# Patient Record
Sex: Female | Born: 1960 | Race: White | Hispanic: No | Marital: Married | Smoking: Former smoker
Health system: Southern US, Community
[De-identification: ages and names within clinical notes are randomized; demographics above are authoritative.]

## PROBLEM LIST (undated history)

## (undated) DIAGNOSIS — T4145XA Adverse effect of unspecified anesthetic, initial encounter: Secondary | ICD-10-CM

## (undated) DIAGNOSIS — T8859XA Other complications of anesthesia, initial encounter: Secondary | ICD-10-CM

## (undated) DIAGNOSIS — F329 Major depressive disorder, single episode, unspecified: Secondary | ICD-10-CM

## (undated) DIAGNOSIS — N6310 Unspecified lump in the right breast, unspecified quadrant: Secondary | ICD-10-CM

## (undated) DIAGNOSIS — E039 Hypothyroidism, unspecified: Secondary | ICD-10-CM

## (undated) DIAGNOSIS — R569 Unspecified convulsions: Secondary | ICD-10-CM

## (undated) DIAGNOSIS — F32A Depression, unspecified: Secondary | ICD-10-CM

## (undated) DIAGNOSIS — F419 Anxiety disorder, unspecified: Secondary | ICD-10-CM

## (undated) DIAGNOSIS — T7840XA Allergy, unspecified, initial encounter: Secondary | ICD-10-CM

## (undated) HISTORY — DX: Hypothyroidism, unspecified: E03.9

## (undated) HISTORY — DX: Anxiety disorder, unspecified: F41.9

## (undated) HISTORY — PX: BREAST BIOPSY: SHX20

## (undated) HISTORY — DX: Major depressive disorder, single episode, unspecified: F32.9

## (undated) HISTORY — PX: COLONOSCOPY: SHX174

## (undated) HISTORY — DX: Unspecified convulsions: R56.9

## (undated) HISTORY — DX: Depression, unspecified: F32.A

## (undated) HISTORY — DX: Allergy, unspecified, initial encounter: T78.40XA

---

## 1998-05-02 ENCOUNTER — Other Ambulatory Visit: Admission: RE | Admit: 1998-05-02 | Discharge: 1998-05-02 | Payer: Self-pay | Admitting: Obstetrics & Gynecology

## 1999-06-23 ENCOUNTER — Other Ambulatory Visit: Admission: RE | Admit: 1999-06-23 | Discharge: 1999-06-23 | Payer: Self-pay | Admitting: Obstetrics & Gynecology

## 2002-11-01 ENCOUNTER — Other Ambulatory Visit: Admission: RE | Admit: 2002-11-01 | Discharge: 2002-11-01 | Payer: Self-pay | Admitting: Obstetrics & Gynecology

## 2004-02-04 ENCOUNTER — Other Ambulatory Visit: Admission: RE | Admit: 2004-02-04 | Discharge: 2004-02-04 | Payer: Self-pay | Admitting: Obstetrics & Gynecology

## 2005-03-25 ENCOUNTER — Other Ambulatory Visit: Admission: RE | Admit: 2005-03-25 | Discharge: 2005-03-25 | Payer: Self-pay | Admitting: Obstetrics & Gynecology

## 2009-12-18 ENCOUNTER — Encounter: Payer: Self-pay | Admitting: Internal Medicine

## 2009-12-19 ENCOUNTER — Encounter (INDEPENDENT_AMBULATORY_CARE_PROVIDER_SITE_OTHER): Payer: Self-pay | Admitting: *Deleted

## 2010-01-13 ENCOUNTER — Encounter (INDEPENDENT_AMBULATORY_CARE_PROVIDER_SITE_OTHER): Payer: Self-pay | Admitting: *Deleted

## 2010-01-14 ENCOUNTER — Ambulatory Visit: Payer: Self-pay | Admitting: Internal Medicine

## 2010-01-27 ENCOUNTER — Ambulatory Visit: Payer: Self-pay | Admitting: Internal Medicine

## 2010-01-27 HISTORY — PX: COLONOSCOPY: SHX174

## 2010-08-13 NOTE — Letter (Signed)
Summary: [facility] Instructions  [facility]  [address] [place], [place] [address]   Phone: [phone]  Fax: [phone]       Destiny Mueller    06/17/1961    MRN: [id number]       Procedure Day Dorna Bloom:  Jake Shark  01/27/10     Arrival Time:  9:30AM     Procedure Time:  10:30AM     Location of Procedure:                    _ X_  [facility] (4th Floor)   PREPARATION FOR COLONOSCOPY WITH MIRALAX  Starting 5 days prior to your procedure 01/22/10 do not eat nuts, seeds, popcorn, corn, beans, peas,  salads, or any raw vegetables.  Do not take any fiber supplements (e.g. Metamucil, Citrucel, and Benefiber). ____________________________________________________________________________________________________   THE DAY BEFORE YOUR PROCEDURE         DATE: 01/26/10  DAY: MONDAY  1   Drink clear liquids the entire day-NO SOLID FOOD  2   Do not drink anything colored red or purple.  Avoid juices with pulp.  No orange juice.  3   Drink at least 64 oz. (8 glasses) of fluid/clear liquids during the day to prevent dehydration and help the prep work efficiently.  CLEAR LIQUIDS INCLUDE: Water Jello Ice Popsicles Tea (sugar ok, no milk/cream) Powdered fruit flavored drinks Coffee (sugar ok, no milk/cream) Gatorade Juice: apple, white grape, white cranberry  Lemonade Clear bullion, consomm, broth Carbonated beverages (any kind) Strained chicken noodle soup Hard Candy  4   Mix the entire bottle of Miralax with 64 oz. of Gatorade/Powerade in the morning and put in the refrigerator to chill.  5   At 3:00 pm take 2 Dulcolax/Bisacodyl tablets.  6   At 4:30 pm take one Reglan/Metoclopramide tablet.  7  Starting at 5:00 pm drink one 8 oz glass of the Miralax mixture every 15-20 minutes until you have finished drinking the entire 64 oz.  You should finish drinking prep around 7:30 or 8:00 pm.  8   If you are nauseated, you may take the 2nd Reglan/Metoclopramide  tablet at 6:30 pm.        9    At 8:00 pm take 2 more DULCOLAX/Bisacodyl tablets.     THE DAY OF YOUR PROCEDURE      DATE:  01/27/10  DAY: Jake Shark  You may drink clear liquids until 8:30AM  (2 HOURS BEFORE PROCEDURE).   MEDICATION INSTRUCTIONS  Unless otherwise instructed, you should take regular prescription medications with a small sip of water as early as possible the morning of your procedure.        OTHER INSTRUCTIONS  You will need a responsible adult at least 51 years of age to accompany you and drive you home.   This person must remain in the waiting room during your procedure.  Wear loose fitting clothing that is easily removed.  Leave jewelry and other valuables at home.  However, you may wish to bring a book to read or an iPod/MP3 player to listen to music as you wait for your procedure to start.  Remove all body piercing jewelry and leave at home.  Total time from sign-in until discharge is approximately 2-3 hours.  You should go home directly after your procedure and rest.  You can resume normal activities the day after your procedure.  The day of your procedure you should not:   Drive  Make legal decisions   Operate machinery   Drink alcohol   Return to work  You will receive specific instructions about eating, activities and medications before you leave.   The above instructions have been reviewed and explained to me by   Wyona Almas RN  January 14, 2010 1:42 PM     I fully understand and can verbalize these instructions _____________________________ Date _______

## 2010-08-13 NOTE — Miscellaneous (Signed)
Summary: [facility] Previsit/prep  Clinical Lists Changes  Medications: Added new medication of DULCOLAX 5 MG  TBEC (BISACODYL) Day before procedure take 2 at 3pm and 2 at 8pm. - Signed Added new medication of METOCLOPRAMIDE HCL 10 MG  TABS (METOCLOPRAMIDE HCL) As per prep instructions. - Signed Added new medication of MIRALAX   POWD (POLYETHYLENE GLYCOL 3350) As per prep  instructions. - Signed Rx of DULCOLAX 5 MG  TBEC (BISACODYL) Day before procedure take 2 at 3pm and 2 at 8pm.;  #4 x 0;  Signed;  Entered by: Wyona Almas RN;  Authorized by: Hart Carwin MD;  Method used: Electronically to [facility]  [phone]*, [address] [place], [place], [place]  [address], Ph: [phone] or [phone], Fax: [phone] Rx of METOCLOPRAMIDE HCL 10 MG  TABS (METOCLOPRAMIDE HCL) As per prep instructions.;  #2 x 0;  Signed;  Entered by: Wyona Almas RN;  Authorized by: Hart Carwin MD;  Method used: Electronically to [facility]  [phone]*, [address] [place], [place], [place]  [address], Ph: [phone] or [phone], Fax: [phone] Rx of MIRALAX   POWD (POLYETHYLENE GLYCOL 3350) As per prep  instructions.;  #255gm x 0;  Signed;  Entered by: Wyona Almas RN;  Authorized by: Hart Carwin MD;  Method used: Electronically to [facility]  [phone]*, [address] [place], [place], [place]  [address], Ph: [phone] or [phone], Fax: [phone] Allergies: Added new allergy or adverse reaction of CODEINE Observations: Added new observation of NKA: F (01/14/2010 13:22)    Prescriptions: MIRALAX   POWD (POLYETHYLENE GLYCOL 3350) As per prep  instructions.  #255gm x 0   Entered by:   Wyona Almas RN   Authorized by:   Hart Carwin MD   Signed by:   Wyona Almas RN on 01/14/2010   Method used:   [facility]ronically to        [facility]  [address] [phone][facility] (retail)       [address]       [place], [place]  [address]       Ph: [phone] or [phone]       Fax: [phone]   RxID:    [id number] METOCLOPRAMIDE HCL 10 MG  TABS (METOCLOPRAMIDE HCL) As per prep instructions.  #2 x 0   Entered by:   Wyona Almas RN   Authorized by:   Hart Carwin MD   Signed by:   Wyona Almas RN on 01/14/2010   Method used:   [facility]ronically to        [facility]  [address] [phone]* (retail)       [address]       [place], [place]  [address]       Ph: [phone] or [phone]       Fax: [phone]   RxID:   [id number] DULCOLAX 5 MG  TBEC (BISACODYL) Day before procedure take 2 at 3pm and 2 at 8pm.  #4 x 0   Entered by:   Wyona Almas RN   Authorized by:   Hart Carwin MD   Signed by:   Wyona Almas RN on 01/14/2010   Method used:   [facility]        [facility]  [address] [phone][facility] ([facility]ail)       [address]  [place], [place]  [address]       Ph: [phone] or [phone]       Fax: [phone]   RxID:   [id number]

## 2010-08-13 NOTE — Letter (Signed)
Summary: [facility]   Imported By: Sherian Rein 12/31/2009 14:41:32  _____________________________________________________________________  External Attachment:    Type:   Image     Comment:   External Document

## 2010-08-13 NOTE — Letter (Signed)
Summary: Previsit letter  [facility]  [address] [place], [place] [address]   Phone: [phone]  Fax: [phone]       12/19/2009 MRN: [id number]  Destiny Mueller [address] [place], [place]  [address]  Dear Ms. Caporale,  Welcome to the Gastroenterology Division at [facility].    You are scheduled to see a nurse for your pre-procedure visit on 01/09/2010 at 1:00PM on the 3rd floor at [facility], [address]  We ask that you try to arrive at our office 15 minutes prior to your appointment time to allow for check-in.  Your nurse visit will consist of discussing your medical and surgical history, your immediate family medical history, and your medications.    Please bring a complete list of all your medications or, if you prefer, bring the medication bottles and we will list them.  We will need to be aware of both prescribed and over the counter drugs.  We will need to know exact dosage information as well.  If you are on blood thinners (Coumadin, Plavix, Aggrenox, Ticlid, etc.) please call our office today/prior to your appointment, as we need to consult with your physician about holding your medication.   Please be prepared to read and sign documents such as consent forms, a financial agreement, and acknowledgement forms.  If necessary, and with your consent, a friend or relative is welcome to sit-in on the nurse visit with you.  Please bring your insurance card so that we may make a copy of it.  If your insurance requires a referral to see a specialist, please bring your referral form from your primary care physician.  No co-pay is required for this nurse visit.     If you cannot keep your appointment, please call [phone] to cancel or reschedule prior to your appointment date.  This allows [place] the opportunity to schedule an appointment for another patient in need of care.    Thank you for choosing [facility] for your medical  needs.  We appreciate the opportunity to care for you.  Please visit [place] at our website  to learn more about our practice.                     Sincerely.                                                                                                                   The Gastroenterology Division

## 2010-08-13 NOTE — Procedures (Signed)
Summary: Colonoscopy  Patient: [name] Note: All result statuses are Final unless otherwise noted.  Tests: (1) Colonoscopy (COL)   COL Colonoscopy           DONE     [facility]     [address]     [place], [place]  [address]           COLONOSCOPY PROCEDURE REPORT           PATIENT:  Destiny Mueller, Destiny Mueller  MR#:  [id number]     BIRTHDATE:  09/12/1960, 49 yrs. old  GENDER:  female     ENDOSCOPIST:  Hedwig Morton. Juanda Chance, MD     REF. BY:  Varney Baas, M.D.     PROCEDURE DATE:  01/27/2010     PROCEDURE:  Colonoscopy 19[address]     ASA CLASS:  Class I     INDICATIONS:  Routine Risk Screening     MEDICATIONS:   Versed 9 mg, Fentanyl 75 mcg, Atropine 0.2 mg           DESCRIPTION OF PROCEDURE:   After the risks benefits and     alternatives of the procedure were thoroughly explained, informed     consent was obtained.  Digital rectal exam was performed and     revealed no rectal masses.   The LB [id number]-[id number] endoscope     was introduced through the anus and advanced to the cecum, which     was identified by both the appendix and ileocecal valve, without     limitations.  The quality of the prep was excellent, using     MiraLax.  The instrument was then slowly withdrawn as the colon     was fully examined.     <<PROCEDUREIMAGES>>           FINDINGS:  No polyps or cancers were seen (see image1, image2,     image3, and image4).   Retroflexed views in the rectum revealed no     abnormalities.    The scope was then withdrawn from the patient     and the procedure completed.           COMPLICATIONS:  None     ENDOSCOPIC IMPRESSION:     1) No polyps or cancers     2) Normal colonoscopy     sinus brdycardia during the procedure, heart rate down to 30/min     with normal B.P and O2 saturations     RECOMMENDATIONS:     1) high fiber diet     consider Atropine prior to next colonoscopy     REPEAT EXAM:  In 10 year(s) for.           ______________________________     Hedwig Morton.  Juanda Chance, MD           CC:           n.     eSIGNED:   Hedwig Morton. Laura Caldas at 01/27/2010 11:41 AM           Charity, Tessier Cidra, [phone]  Note: An exclamation mark (!) indicates a result that was not dispersed into the flowsheet. Document Creation Date: 01/27/2010 11:42 AM _______________________________________________________________________  (1) Order result status: Final Collection or observation date-time: 01/27/2010 11:33 Requested date-time:  Receipt date-time:  Reported date-time:  Referring Physician:   Ordering Physician: Lina Sar [phone]) Specimen Source:  Source: Launa Grill Order Number: [id number] Lab site:   Appended Document: Colonoscopy    Clinical  Lists Changes  Observations: Added new observation of COLONNXTDUE: 01/2020 (01/27/2010 13:01)

## 2013-10-23 ENCOUNTER — Encounter: Payer: Self-pay | Admitting: Family Medicine

## 2013-10-23 ENCOUNTER — Ambulatory Visit (INDEPENDENT_AMBULATORY_CARE_PROVIDER_SITE_OTHER): Payer: BC Managed Care – PPO | Admitting: Family Medicine

## 2013-10-23 VITALS — BP 100/60 | Temp 98.4°F | Ht 68.0 in | Wt 134.0 lb

## 2013-10-23 DIAGNOSIS — F418 Other specified anxiety disorders: Secondary | ICD-10-CM

## 2013-10-23 DIAGNOSIS — F341 Dysthymic disorder: Secondary | ICD-10-CM

## 2013-10-23 DIAGNOSIS — E039 Hypothyroidism, unspecified: Secondary | ICD-10-CM | POA: Insufficient documentation

## 2013-10-23 NOTE — Progress Notes (Signed)
   Subjective:    Patient ID: Destiny Mueller, female    DOB: 30-Mar-1961, 53 y.o.   MRN: [id number]  HPI 53 yr old female to establish with us. She feels fine and has no problems to discuss. She sees Dr. Konrad Doloreson Neal for GYN exams and he as also been treating her depression and anxiety for a number of years. She sees Dr. Talmage NapBalan for her thyroid checks. 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    Review of Systems  Constitutional: Negative.   Respiratory: Negative.   Cardiovascular: Negative.   Neurological: Negative.   Psychiatric/Behavioral: Negative.        Objective:   Physical Exam  Constitutional: She is oriented to person, place, and time. She appears well-developed and well-nourished.  Neck: No thyromegaly present.  Cardiovascular: Normal rate, regular rhythm, normal heart sounds and intact distal pulses.   Pulmonary/Chest: Effort normal and breath sounds normal.  Lymphadenopathy:    She has no cervical adenopathy.  Neurological: She is alert and oriented to person, place, and time.  Psychiatric: She has a normal mood and affect. Her behavior is normal. Thought content normal.          Assessment & Plan:  She seems to be doing well. Recheck prn

## 2014-10-10 ENCOUNTER — Other Ambulatory Visit: Payer: Self-pay | Admitting: Obstetrics & Gynecology

## 2014-10-10 DIAGNOSIS — R928 Other abnormal and inconclusive findings on diagnostic imaging of breast: Secondary | ICD-10-CM

## 2014-10-17 ENCOUNTER — Ambulatory Visit
Admission: RE | Admit: 2014-10-17 | Discharge: 2014-10-17 | Disposition: A | Discharge status: Home / Self Care | Payer: BC Managed Care – PPO | Source: Ambulatory Visit | Attending: Obstetrics & Gynecology | Admitting: Obstetrics & Gynecology

## 2014-10-17 DIAGNOSIS — R928 Other abnormal and inconclusive findings on diagnostic imaging of breast: Secondary | ICD-10-CM

## 2015-07-13 HISTORY — PX: BREAST EXCISIONAL BIOPSY: SUR124

## 2015-10-30 ENCOUNTER — Other Ambulatory Visit: Payer: Self-pay | Admitting: Obstetrics & Gynecology

## 2015-10-30 ENCOUNTER — Ambulatory Visit
Admission: RE | Admit: 2015-10-30 | Discharge: 2015-10-30 | Disposition: A | Discharge status: Home / Self Care | Payer: BC Managed Care – PPO | Source: Ambulatory Visit | Attending: Obstetrics & Gynecology | Admitting: Obstetrics & Gynecology

## 2015-10-30 DIAGNOSIS — R928 Other abnormal and inconclusive findings on diagnostic imaging of breast: Secondary | ICD-10-CM

## 2015-11-06 ENCOUNTER — Ambulatory Visit
Admission: RE | Admit: 2015-11-06 | Discharge: 2015-11-06 | Disposition: A | Discharge status: Home / Self Care | Payer: BC Managed Care – PPO | Source: Ambulatory Visit | Attending: Obstetrics & Gynecology | Admitting: Obstetrics & Gynecology

## 2015-11-06 ENCOUNTER — Other Ambulatory Visit: Payer: Self-pay | Admitting: Obstetrics & Gynecology

## 2015-11-06 DIAGNOSIS — R928 Other abnormal and inconclusive findings on diagnostic imaging of breast: Secondary | ICD-10-CM

## 2015-11-18 ENCOUNTER — Ambulatory Visit: Payer: Self-pay | Admitting: General Surgery

## 2015-11-18 DIAGNOSIS — N6489 Other specified disorders of breast: Secondary | ICD-10-CM

## 2015-11-18 NOTE — H&P (Signed)
Destiny ConverseCristy J. Mueller 11/18/2015 3:32 PM Location: [facility] Patient #: [id number] DOB: [birth year] Married / Language: [place] / Race: White Female  History of Present Illness [name]([name] MD; 11/18/2015 5:26 PM) The patient is a 55 year old female.   Note:She is referred by Dr. 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 She had a slightly atypical area on the mammogram a year ago. This year they're still a vague distortion upper outer quadrant right breast. Image guided biopsy was performed with the above pathology. Wider excision is recommended and so she's been sent here to discuss that. There is no family history of breast or ovarian cancer. No palpable masses. No nipple discharge or breast pain.  Other Problems [name]([name], CMA; 11/18/2015 3:34 PM) Anxiety Disorder Thyroid Disease  Past Surgical History [name]([name], CMA; 11/18/2015 3:34 PM) Oral Surgery  Diagnostic Studies History [name]([name], [place]; 11/18/2015 3:34 PM) Colonoscopy 5-10 years ago Mammogram within last year Pap Smear 1-5 years ago  Allergies [name]([name], CMA; 11/18/2015 3:34 PM) Codeine Sulfate *ANALGESICS - OPIOID*  Medication History [name]([name], CMA; 11/18/2015 3:34 PM) ALPRAZolam (0.5MG  Tablet, Oral) Active. Citalopram Hydrobromide (40MG  Tablet, Oral) Active. Levothyroxine Sodium (75MCG Tablet, Oral) Active. Medications Reconciled  Social History [name]([name], [place]; 11/18/2015 3:34 PM) Alcohol use Moderate alcohol use. Caffeine use Coffee. No drug use Tobacco use Former smoker.  Family History [name]([name], [place]; 11/18/2015 3:34 PM) Hypertension Mother. Thyroid problems Mother, Sister.  Pregnancy / Birth History [name]([name], [place]; 11/18/2015 3:34 PM) Age at menarche 12 years. Age of menopause 50-55 Contraceptive History Oral contraceptives. Gravida 1 Maternal age 56-35 Para 1    Vitals [name]([name]  CMA; 11/18/2015 3:35 PM) 11/18/2015 3:34 PM Weight: 132 lb Height: 68in Body Surface Area: 1.71 m Body Mass Index: 20.07 kg/m  Temp.: 98.98F  Pulse: 69 (Regular)  BP: 128/78 (Sitting, Left Arm, Standard)      Physical Exam [name](Darnesha Diloreto J. Skarlet Lyons MD; 11/18/2015 5:27 PM)  The physical exam findings are as follows: Note:General: Thin female in NAD. Pleasant and cooperative.  HEENT: [place]/AT, no facial masses  EYES: EOMI, no icterus  NECK: Supple, no obvious mass.  CV: RRR, no murmur, no JVD.  CHEST: Breath sounds equal and clear. Respirations nonlabored.  BREASTS: Symmetrical in size. No dominant masses, nipple discharge or suspicious skin lesions. Small needle hole and ecchymosis in right breast  ABDOMEN: Soft, nontender, nondistended.  LYMPHATIC: No palpable cervical, supraclavicular, axillary adenopathy.  SKIN: No jaundice or suspicious rashes.  NEUROLOGIC: Alert and oriented, answers questions appropriately, normal gait and station.  PSYCHIATRIC: Normal mood, affect , and behavior.    Assessment & Plan [name](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 We talked about the increased risk of there being a neoplastic process in this area and the recommendation for wider excision. She would like to proceed with that.  Plan: Right breast lumpectomy after radioactive seed localization. I have explained the procedure, risks, and aftercare to her. Risks include but are not limited to bleeding, infection, wound problems, seroma formation, cosmetic deformity, anesthesia. [facility] and agrees with the plan.  [name], MD

## 2015-12-01 ENCOUNTER — Other Ambulatory Visit: Payer: Self-pay | Admitting: General Surgery

## 2015-12-01 DIAGNOSIS — N6489 Other specified disorders of breast: Secondary | ICD-10-CM

## 2015-12-10 ENCOUNTER — Encounter (HOSPITAL_BASED_OUTPATIENT_CLINIC_OR_DEPARTMENT_OTHER)
Admission: RE | Admit: 2015-12-10 | Discharge: 2015-12-10 | Disposition: A | Discharge status: Home / Self Care | Payer: BC Managed Care – PPO | Source: Ambulatory Visit | Attending: General Surgery | Admitting: General Surgery

## 2015-12-10 ENCOUNTER — Encounter (HOSPITAL_BASED_OUTPATIENT_CLINIC_OR_DEPARTMENT_OTHER): Payer: Self-pay | Admitting: *Deleted

## 2015-12-10 DIAGNOSIS — E039 Hypothyroidism, unspecified: Secondary | ICD-10-CM | POA: Diagnosis not present

## 2015-12-10 DIAGNOSIS — Z87891 Personal history of nicotine dependence: Secondary | ICD-10-CM | POA: Diagnosis not present

## 2015-12-10 DIAGNOSIS — N6489 Other specified disorders of breast: Secondary | ICD-10-CM | POA: Diagnosis not present

## 2015-12-10 DIAGNOSIS — F419 Anxiety disorder, unspecified: Secondary | ICD-10-CM | POA: Diagnosis not present

## 2015-12-10 DIAGNOSIS — N62 Hypertrophy of breast: Secondary | ICD-10-CM | POA: Diagnosis present

## 2015-12-10 LAB — CBC WITH DIFFERENTIAL/PLATELET
BASOS ABS: 0 10*3/uL (ref 0.0–0.1)
Basophils Relative: 0 %
EOS PCT: 1 %
Eosinophils Absolute: 0.1 10*3/uL (ref 0.0–0.7)
HCT: 38.3 % (ref 36.0–46.0)
HEMOGLOBIN: 12.2 g/dL (ref 12.0–15.0)
LYMPHS PCT: 39 %
Lymphs Abs: 2.5 10*3/uL (ref 0.7–4.0)
MCH: 28.8 pg (ref 26.0–34.0)
MCHC: 31.9 g/dL (ref 30.0–36.0)
MCV: 90.5 fL (ref 78.0–100.0)
Monocytes Absolute: 0.4 10*3/uL (ref 0.1–1.0)
Monocytes Relative: 6 %
NEUTROS PCT: 54 %
Neutro Abs: 3.4 10*3/uL (ref 1.7–7.7)
PLATELETS: 159 10*3/uL (ref 150–400)
RBC: 4.23 MIL/uL (ref 3.87–5.11)
RDW: 12.4 % (ref 11.5–15.5)
WBC: 6.3 10*3/uL (ref 4.0–10.5)

## 2015-12-10 LAB — COMPREHENSIVE METABOLIC PANEL
ALK PHOS: 58 U/L (ref 38–126)
ALT: 13 U/L — AB (ref 14–54)
AST: 21 U/L (ref 15–41)
Albumin: 4 g/dL (ref 3.5–5.0)
Anion gap: 5 (ref 5–15)
BUN: 16 mg/dL (ref 6–20)
CHLORIDE: 101 mmol/L (ref 101–111)
CO2: 30 mmol/L (ref 22–32)
CREATININE: 0.93 mg/dL (ref 0.44–1.00)
Calcium: 9.5 mg/dL (ref 8.9–10.3)
GFR calc Af Amer: >60 mL/min (ref >60)
Glucose, Bld: 133 mg/dL — ABNORMAL HIGH (ref 65–99)
Potassium: 3.8 mmol/L (ref 3.5–5.1)
SODIUM: 136 mmol/L (ref 135–145)
Total Bilirubin: 0.7 mg/dL (ref 0.3–1.2)
Total Protein: 6.2 g/dL — ABNORMAL LOW (ref 6.5–8.1)

## 2015-12-10 NOTE — Progress Notes (Signed)
Pt here for bloodwork. Also gave pt 8 oz boost wild berry box per protocol. Instructed pt written and verbal to drink boost on DOS by 0600. Pt verbalized understanding

## 2015-12-15 ENCOUNTER — Ambulatory Visit
Admission: RE | Admit: 2015-12-15 | Discharge: 2015-12-15 | Disposition: A | Discharge status: Home / Self Care | Payer: BC Managed Care – PPO | Source: Ambulatory Visit | Attending: General Surgery | Admitting: General Surgery

## 2015-12-15 DIAGNOSIS — N6489 Other specified disorders of breast: Secondary | ICD-10-CM

## 2015-12-16 ENCOUNTER — Ambulatory Visit (HOSPITAL_BASED_OUTPATIENT_CLINIC_OR_DEPARTMENT_OTHER): Payer: BC Managed Care – PPO | Admitting: Certified Registered"

## 2015-12-16 ENCOUNTER — Ambulatory Visit
Admission: RE | Admit: 2015-12-16 | Discharge: 2015-12-16 | Disposition: A | Discharge status: Home / Self Care | Payer: BC Managed Care – PPO | Source: Ambulatory Visit | Attending: General Surgery | Admitting: General Surgery

## 2015-12-16 ENCOUNTER — Ambulatory Visit (HOSPITAL_BASED_OUTPATIENT_CLINIC_OR_DEPARTMENT_OTHER)
Admission: RE | Admit: 2015-12-16 | Discharge: 2015-12-16 | Disposition: A | Discharge status: Home / Self Care | Payer: BC Managed Care – PPO | Source: Ambulatory Visit | Attending: General Surgery | Admitting: General Surgery

## 2015-12-16 ENCOUNTER — Encounter (HOSPITAL_BASED_OUTPATIENT_CLINIC_OR_DEPARTMENT_OTHER): Payer: Self-pay | Admitting: Certified Registered"

## 2015-12-16 ENCOUNTER — Encounter (HOSPITAL_BASED_OUTPATIENT_CLINIC_OR_DEPARTMENT_OTHER)
Admission: RE | Disposition: A | Discharge status: Home / Self Care | Payer: Self-pay | Source: Ambulatory Visit | Attending: General Surgery

## 2015-12-16 DIAGNOSIS — E039 Hypothyroidism, unspecified: Secondary | ICD-10-CM | POA: Insufficient documentation

## 2015-12-16 DIAGNOSIS — N62 Hypertrophy of breast: Secondary | ICD-10-CM | POA: Diagnosis not present

## 2015-12-16 DIAGNOSIS — Z87891 Personal history of nicotine dependence: Secondary | ICD-10-CM | POA: Insufficient documentation

## 2015-12-16 DIAGNOSIS — N6489 Other specified disorders of breast: Secondary | ICD-10-CM | POA: Insufficient documentation

## 2015-12-16 DIAGNOSIS — F419 Anxiety disorder, unspecified: Secondary | ICD-10-CM | POA: Insufficient documentation

## 2015-12-16 HISTORY — DX: Unspecified lump in the right breast, unspecified quadrant: N63.10

## 2015-12-16 HISTORY — PX: BREAST LUMPECTOMY WITH RADIOACTIVE SEED LOCALIZATION: SHX6424

## 2015-12-16 HISTORY — DX: Other complications of anesthesia, initial encounter: T88.59XA

## 2015-12-16 HISTORY — DX: Adverse effect of unspecified anesthetic, initial encounter: T41.45XA

## 2015-12-16 SURGERY — BREAST LUMPECTOMY WITH RADIOACTIVE SEED LOCALIZATION
Anesthesia: General | Site: Breast | Laterality: Right

## 2015-12-16 MED ORDER — OXYCODONE HCL 5 MG PO TABS: 5.0000 mg | ORAL_TABLET | Freq: Once | ORAL | Status: DC | PRN

## 2015-12-16 MED ORDER — PROMETHAZINE HCL 25 MG/ML IJ SOLN: 6.2500 mg | INTRAMUSCULAR | Status: DC | PRN

## 2015-12-16 MED ORDER — CEFAZOLIN SODIUM-DEXTROSE 2-4 GM/100ML-% IV SOLN
2.0000 g | INTRAVENOUS | Status: AC
  Administered 2015-12-16: 2 g via INTRAVENOUS

## 2015-12-16 MED ORDER — LACTATED RINGERS IV SOLN
INTRAVENOUS | Status: DC
  Administered 2015-12-16 (×2): via INTRAVENOUS

## 2015-12-16 MED ORDER — SCOPOLAMINE 1 MG/3DAYS TD PT72: 1.0000 | MEDICATED_PATCH | Freq: Once | TRANSDERMAL | Status: DC | PRN

## 2015-12-16 MED ORDER — HYDROMORPHONE HCL 1 MG/ML IJ SOLN
0.2500 mg | INTRAMUSCULAR | Status: DC | PRN
Start: 2015-12-16 — End: 2015-12-16
  Administered 2015-12-16 (×2): 0.5 mg via INTRAVENOUS

## 2015-12-16 MED ORDER — BUPIVACAINE HCL (PF) 0.5 % IJ SOLN
INTRAMUSCULAR | Status: DC | PRN
  Administered 2015-12-16: 7 mL

## 2015-12-16 MED ORDER — HYDROCODONE-ACETAMINOPHEN 5-325 MG PO TABS: 1.0000 | ORAL_TABLET | ORAL | Status: DC | PRN

## 2015-12-16 MED ORDER — CEFAZOLIN SODIUM-DEXTROSE 2-4 GM/100ML-% IV SOLN
INTRAVENOUS | Status: AC
  Filled 2015-12-16: qty 100

## 2015-12-16 MED ORDER — HYDROMORPHONE HCL 1 MG/ML IJ SOLN
INTRAMUSCULAR | Status: AC
  Filled 2015-12-16: qty 1

## 2015-12-16 MED ORDER — MEPERIDINE HCL 25 MG/ML IJ SOLN: 6.2500 mg | INTRAMUSCULAR | Status: DC | PRN

## 2015-12-16 MED ORDER — EPHEDRINE 5 MG/ML INJ
INTRAVENOUS | Status: AC
  Filled 2015-12-16: qty 10

## 2015-12-16 MED ORDER — EPHEDRINE SULFATE 50 MG/ML IJ SOLN
INTRAMUSCULAR | Status: DC | PRN
  Administered 2015-12-16 (×4): 5 mg via INTRAVENOUS

## 2015-12-16 MED ORDER — FENTANYL CITRATE (PF) 100 MCG/2ML IJ SOLN
50.0000 ug | INTRAMUSCULAR | Status: AC | PRN
  Administered 2015-12-16: 25 ug via INTRAVENOUS
  Administered 2015-12-16: 50 ug via INTRAVENOUS
  Administered 2015-12-16: 25 ug via INTRAVENOUS

## 2015-12-16 MED ORDER — ONDANSETRON HCL 4 MG/2ML IJ SOLN
INTRAMUSCULAR | Status: DC | PRN
  Administered 2015-12-16: 4 mg via INTRAVENOUS

## 2015-12-16 MED ORDER — DEXAMETHASONE SODIUM PHOSPHATE 4 MG/ML IJ SOLN
INTRAMUSCULAR | Status: DC | PRN
  Administered 2015-12-16: 10 mg via INTRAVENOUS

## 2015-12-16 MED ORDER — MIDAZOLAM HCL 2 MG/2ML IJ SOLN
1.0000 mg | INTRAMUSCULAR | Status: DC | PRN
  Administered 2015-12-16 (×2): 1 mg via INTRAVENOUS

## 2015-12-16 MED ORDER — PROPOFOL 10 MG/ML IV BOLUS
INTRAVENOUS | Status: DC | PRN
  Administered 2015-12-16: 200 mg via INTRAVENOUS

## 2015-12-16 MED ORDER — MIDAZOLAM HCL 2 MG/2ML IJ SOLN
INTRAMUSCULAR | Status: AC
  Filled 2015-12-16: qty 2

## 2015-12-16 MED ORDER — GLYCOPYRROLATE 0.2 MG/ML IJ SOLN: 0.2000 mg | Freq: Once | INTRAMUSCULAR | Status: DC | PRN

## 2015-12-16 MED ORDER — OXYCODONE HCL 5 MG/5ML PO SOLN: 5.0000 mg | Freq: Once | ORAL | Status: DC | PRN

## 2015-12-16 MED ORDER — LIDOCAINE HCL (CARDIAC) 20 MG/ML IV SOLN
INTRAVENOUS | Status: DC | PRN
  Administered 2015-12-16: 60 mg via INTRAVENOUS

## 2015-12-16 MED ORDER — LIDOCAINE 2% (20 MG/ML) 5 ML SYRINGE
INTRAMUSCULAR | Status: AC
  Filled 2015-12-16: qty 5

## 2015-12-16 MED ORDER — FENTANYL CITRATE (PF) 100 MCG/2ML IJ SOLN
INTRAMUSCULAR | Status: AC
  Filled 2015-12-16: qty 2

## 2015-12-16 MED ORDER — LACTATED RINGERS IV SOLN: INTRAVENOUS | Status: DC

## 2015-12-16 MED ORDER — ONDANSETRON HCL 4 MG/2ML IJ SOLN
INTRAMUSCULAR | Status: AC
  Filled 2015-12-16: qty 2

## 2015-12-16 MED ORDER — DEXAMETHASONE SODIUM PHOSPHATE 10 MG/ML IJ SOLN
INTRAMUSCULAR | Status: AC
  Filled 2015-12-16: qty 1

## 2015-12-16 SURGICAL SUPPLY — 59 items
ADH SKN CLS APL DERMABOND .7 (GAUZE/BANDAGES/DRESSINGS)
APL SKNCLS STERI-STRIP NONHPOA (GAUZE/BANDAGES/DRESSINGS) ×1
APPLIER CLIP 9.375 MED OPEN (MISCELLANEOUS) ×3
APR CLP MED 9.3 20 MLT OPN (MISCELLANEOUS) ×1
BENZOIN TINCTURE PRP APPL 2/3 (GAUZE/BANDAGES/DRESSINGS) ×2 IMPLANT
BINDER BREAST LRG (GAUZE/BANDAGES/DRESSINGS) IMPLANT
BINDER BREAST MEDIUM (GAUZE/BANDAGES/DRESSINGS) ×2 IMPLANT
BINDER BREAST XLRG (GAUZE/BANDAGES/DRESSINGS) IMPLANT
BINDER BREAST XXLRG (GAUZE/BANDAGES/DRESSINGS) IMPLANT
BLADE SURG 15 STRL LF DISP TIS (BLADE) ×1 IMPLANT
BLADE SURG 15 STRL SS (BLADE) ×3
CANISTER SUC SOCK COL 7IN (MISCELLANEOUS) IMPLANT
CANISTER SUCT 1200ML W/VALVE (MISCELLANEOUS) ×2 IMPLANT
CHLORAPREP W/TINT 26ML (MISCELLANEOUS) ×3 IMPLANT
CLIP APPLIE 9.375 MED OPEN (MISCELLANEOUS) IMPLANT
CLOSURE WOUND 1/2 X4 (GAUZE/BANDAGES/DRESSINGS) ×1
COVER BACK TABLE 60X90IN (DRAPES) ×3 IMPLANT
COVER MAYO STAND STRL (DRAPES) ×3 IMPLANT
COVER PROBE W GEL 5X96 (DRAPES) ×3 IMPLANT
DECANTER SPIKE VIAL GLASS SM (MISCELLANEOUS) IMPLANT
DERMABOND ADVANCED (GAUZE/BANDAGES/DRESSINGS)
DERMABOND ADVANCED .7 DNX12 (GAUZE/BANDAGES/DRESSINGS) IMPLANT
DEVICE DUBIN W/COMP PLATE 8390 (MISCELLANEOUS) ×2 IMPLANT
DRAPE LAPAROTOMY 100X72 PEDS (DRAPES) ×3 IMPLANT
DRAPE UTILITY XL STRL (DRAPES) ×3 IMPLANT
DRSG TELFA 3X8 NADH (GAUZE/BANDAGES/DRESSINGS) IMPLANT
ELECT COATED BLADE 2.86 ST (ELECTRODE) ×3 IMPLANT
ELECT REM PT RETURN 9FT ADLT (ELECTROSURGICAL) ×3
ELECTRODE REM PT RTRN 9FT ADLT (ELECTROSURGICAL) ×1 IMPLANT
GAUZE SPONGE 4X4 12PLY STRL (GAUZE/BANDAGES/DRESSINGS) ×3 IMPLANT
GLOVE BIOGEL PI IND STRL 7.0 (GLOVE) IMPLANT
GLOVE BIOGEL PI IND STRL 8.5 (GLOVE) ×1 IMPLANT
GLOVE BIOGEL PI INDICATOR 7.0 (GLOVE) ×2
GLOVE BIOGEL PI INDICATOR 8.5 (GLOVE) ×2
GLOVE ECLIPSE 6.5 STRL STRAW (GLOVE) ×2 IMPLANT
GLOVE ECLIPSE 8.0 STRL XLNG CF (GLOVE) ×3 IMPLANT
GLOVE EXAM NITRILE EXT CUFF MD (GLOVE) ×2 IMPLANT
GOWN STRL REUS W/ TWL LRG LVL3 (GOWN DISPOSABLE) ×2 IMPLANT
GOWN STRL REUS W/TWL LRG LVL3 (GOWN DISPOSABLE) ×6
KIT MARKER MARGIN INK (KITS) ×3 IMPLANT
NDL HYPO 25X1 1.5 SAFETY (NEEDLE) ×1 IMPLANT
NEEDLE HYPO 25X1 1.5 SAFETY (NEEDLE) ×3 IMPLANT
NS IRRIG 1000ML POUR BTL (IV SOLUTION) ×3 IMPLANT
PACK BASIN DAY SURGERY FS (CUSTOM PROCEDURE TRAY) ×3 IMPLANT
PAD DRESSING TELFA 3X8 NADH (GAUZE/BANDAGES/DRESSINGS) ×1 IMPLANT
PENCIL BUTTON HOLSTER BLD 10FT (ELECTRODE) ×3 IMPLANT
SLEEVE SCD COMPRESS KNEE MED (MISCELLANEOUS) ×3 IMPLANT
SPONGE GAUZE 4X4 12PLY STER LF (GAUZE/BANDAGES/DRESSINGS) ×1 IMPLANT
SPONGE LAP 4X18 X RAY DECT (DISPOSABLE) ×3 IMPLANT
STRIP CLOSURE SKIN 1/2X4 (GAUZE/BANDAGES/DRESSINGS) ×1 IMPLANT
SUT MON AB 4-0 PC3 18 (SUTURE) ×3 IMPLANT
SUT SILK 2 0 FS (SUTURE) ×3 IMPLANT
SUT VICRYL 3-0 CR8 SH (SUTURE) ×3 IMPLANT
SYR CONTROL 10ML LL (SYRINGE) ×3 IMPLANT
TOWEL OR 17X24 6PK STRL BLUE (TOWEL DISPOSABLE) ×3 IMPLANT
TOWEL OR NON WOVEN STRL DISP B (DISPOSABLE) ×1 IMPLANT
TUBE CONNECTING 20'X1/4 (TUBING) ×1
TUBE CONNECTING 20X1/4 (TUBING) ×1 IMPLANT
YANKAUER SUCT BULB TIP NO VENT (SUCTIONS) ×2 IMPLANT

## 2015-12-16 NOTE — Transfer of Care (Signed)
Immediate Anesthesia Transfer of Care Note  Patient: Destiny Mueller  Procedure(s) Performed: Procedure(s): RIGHT BREAST LUMPECTOMY WITH RADIOACTIVE SEED LOCALIZATION (Right)  Patient Location: PACU  Anesthesia Type:General  Level of Consciousness: awake, sedated and responds to stimulation  Airway & Oxygen Therapy: Patient Spontanous Breathing and Patient connected to face mask oxygen  Post-op Assessment: Report given to RN, Post -op Vital signs reviewed and stable and Patient moving all extremities  Post vital signs: Reviewed and stable  Last Vitals:  Filed Vitals:   12/16/15 1056 12/16/15 1057  BP: 129/74   Pulse: 78 76  Temp:    Resp:  15    Last Pain: There were no vitals filed for this visit.    Patients Stated Pain Goal: 0 (12/16/15 0830)  Complications: No apparent anesthesia complications

## 2015-12-16 NOTE — Op Note (Signed)
Operative Note  Destiny ConverseCristy J Mueller female 55 y.o. 12/16/2015  PREOPERATIVE DX:  Complex sclerosing lesion right breast  POSTOPERATIVE DX:  Same  PROCEDURE:   Right breast lumpectomy after radioactive seed localization         Surgeon: Tonesha Tsou J   Assistants: none  Anesthesia: General mask inhalational anesthesia + local (Marcaine)  Indications:   This is a 55 year old female who had an area of abnormality and a screening mammogram  At the 11:00 position inferior to the nipple areolar complex. Image guided biopsy demonstrated a complex sclerosing lesion and wider excision was recommended. She underwent successful radioactive seed implantation. She now presents for the above procedure.    Procedure Detail:  She was seen in the holding area. Using a neoprobe, I verified position of the seed in the right breast. My initials were then placed on the right breast. She was brought to the operating room placed supine on the operating table and a general anesthetic was administered. The right breast was sterilely prepped and draped. A timeout was performed.  Local anesthetic was infiltrated in the skin in the periareolar area from 90 7:00 to 1:00 positions. An incision was made from the 7:00 to 1:00 position in a circumareolar fashion dividing the skin and dermis sharply. Skin and subcutaneous flaps were raised in all directions. Using a neoprobe, I found the area of increased counts and marked this was with a suture to orient me to the anterior margin. I then performed a lumpectomy using the neoprobe to keep the area of increased counts in the middle of the specimen. Once the specimen was removed, the margins were inked. A specimen mammogram was performed which demonstrated the area of concern in the middle of the specimen along with the marking clip and the radioactive seed. This was verified by the radiologist. There was no radioactivity in the lumpectomy bed.    The wound was inspected and  bleeding controlled with electrocautery. Marcaine solution was infiltrated into the wound for local anesthesia effect.  Subcutaneous tissues and approximated with interrupted 3-0 Vicryl sutures. The skin was closed with a running 4-0 Monocryl subcuticular stitch. Steri-Strips and sterile dressing were applied. A breast binder was applied.  She tolerated the procedure without any apparent complications. She was taken to the recovery room in satisfactory condition. Estimated blood loss was approximately 100 cc.         Specimens: Right breast tissue        Complications:  * No complications entered in OR log *         Disposition: PACU - hemodynamically stable.         Condition: stable

## 2015-12-16 NOTE — Anesthesia Preprocedure Evaluation (Addendum)
Anesthesia Evaluation  Patient identified by MRN, date of birth, ID band Patient awake    Reviewed: Allergy & Precautions, NPO status , Patient's Chart, lab work & pertinent test results  Airway Mallampati: II  TM Distance: >3 FB Neck ROM: Full    Dental  (+) Teeth Intact, Dental Advisory Given   Pulmonary former smoker,    breath sounds clear to auscultation       Cardiovascular negative cardio ROS   Rhythm:Regular Rate:Normal     Neuro/Psych PSYCHIATRIC DISORDERS Anxiety Depression    GI/Hepatic negative GI ROS, Neg liver ROS,   Endo/Other  Hypothyroidism   Renal/GU negative Renal ROS  negative genitourinary   Musculoskeletal negative musculoskeletal ROS (+)   Abdominal   Peds negative pediatric ROS (+)  Hematology negative hematology ROS (+)   Anesthesia Other Findings   Reproductive/Obstetrics negative OB ROS                            Anesthesia Physical Anesthesia Plan  ASA: II  Anesthesia Plan: General   Post-op Pain Management:    Induction: Intravenous  Airway Management Planned: LMA  Additional Equipment:   Intra-op Plan:   Post-operative Plan: Extubation in OR  Informed Consent: I have reviewed the patients History and Physical, chart, labs and discussed the procedure including the risks, benefits and alternatives for the proposed anesthesia with the patient or authorized representative who has indicated his/her understanding and acceptance.   Dental advisory given  Plan Discussed with: CRNA  Anesthesia Plan Comments:         Anesthesia Quick Evaluation

## 2015-12-16 NOTE — Anesthesia Postprocedure Evaluation (Signed)
Anesthesia Post Note  Patient: Destiny Mueller  Procedure(s) Performed: Procedure(s) (LRB): RIGHT BREAST LUMPECTOMY WITH RADIOACTIVE SEED LOCALIZATION (Right)  Patient location during evaluation: PACU Anesthesia Type: General Level of consciousness: awake and alert Pain management: pain level controlled Vital Signs Assessment: post-procedure vital signs reviewed and stable Respiratory status: spontaneous breathing, nonlabored ventilation, respiratory function stable and patient connected to nasal cannula oxygen Cardiovascular status: blood pressure returned to baseline and stable Postop Assessment: no signs of nausea or vomiting Anesthetic complications: no    Last Vitals:  Filed Vitals:   12/16/15 1145 12/16/15 1200  BP: 117/72 117/60  Pulse: 56 55  Temp:    Resp: 10 14    Last Pain:  Filed Vitals:   12/16/15 1201  PainSc: 3                  Shelton SilvasKevin D Mikalia Fessel

## 2015-12-16 NOTE — Interval H&P Note (Signed)
History and Physical Interval Note:  12/16/2015 9:33 AM  Destiny Mueller  has presented today for surgery, with the diagnosis of complex sclerosing lesion of right breast  The various methods of treatment have been discussed with the patient and family. After consideration of risks, benefits and other options for treatment, the patient has consented to  Procedure(s): RIGHT BREAST LUMPECTOMY WITH RADIOACTIVE SEED LOCALIZATION (Right) as a surgical intervention .  The patient's history has been reviewed, patient examined, no change in status, stable for surgery.  I have reviewed the patient's chart and labs.  Questions were answered to the patient's satisfaction.     Shylie Polo JShela Commons

## 2015-12-16 NOTE — Discharge Instructions (Addendum)
[facility] Office Phone Number [phone]  BREAST BIOPSY/ PARTIAL MASTECTOMY: POST OP INSTRUCTIONS  Always review your discharge instruction sheet given to you by the facility where your surgery was performed.  IF YOU HAVE DISABILITY OR FAMILY LEAVE FORMS, YOU MUST BRING THEM TO THE OFFICE FOR PROCESSING.  DO NOT GIVE THEM TO YOUR DOCTOR.  1. A prescription for pain medication may be given to you upon discharge.  Take your pain medication as prescribed, if needed.  If narcotic pain medicine is not needed, then you may take acetaminophen (Tylenol) or ibuprofen (Advil) as needed. 2. Take your usually prescribed medications unless otherwise directed 3. If you need a refill on your pain medication, please contact your pharmacy.  They will contact our office to request authorization.  Prescriptions will not be filled after 5pm or on week-ends. 4. You should eat very light the first 24 hours after surgery, such as soup, crackers, pudding, etc.  Resume your normal diet the day after surgery. 5. Most patients will experience some swelling and bruising in the breast.  Ice packs and a good support bra will help.  Swelling and bruising can take several days to resolve.  6. It is common to experience some constipation if taking pain medication after surgery.  Increasing fluid intake and taking a stool softener will usually help or prevent this problem from occurring.  A mild laxative (Milk of Magnesia or Miralax) should be taken according to package directions if there are no bowel movements after 48 hours. 7. Unless discharge instructions indicate otherwise, you may remove your bandages 48 hours after surgery, and you may shower at that time.  You may have steri-strips (small skin tapes) in place directly over the incision.  These strips should be left on the skin.  If your surgeon used skin glue on the incision, you may shower in 24 hours.  The glue will flake off over the next 2-3 weeks.   Any sutures or staples will be removed at the office during your follow-up visit. 8. ACTIVITIES:  You may resume light daily activities (gradually increasing) beginning the next day and continue for 3-7 days until you are pain-free.  Wearing a good support bra or sports bra minimizes pain and swelling.  You may have sexual intercourse when it is comfortable. a. You may drive when you no longer are taking prescription pain medication, you can comfortably wear a seatbelt, and you can safely maneuver your car and apply brakes. b. RETURN TO WORK:  ______________________________________________________________________________________ 9. You should see your doctor in the office for a follow-up appointment approximately two weeks after your surgery.  Please call and make this appointment if you do not already have one.  Expect your pathology report 3 business days after your surgery.  You may call to check if you do not hear from us after three days (ask for AT&TBernie Morris). 10. OTHER INSTRUCTIONS: _______________________________________________________________________________________________ _____________________________________________________________________________________________________________________________________ _____________________________________________________________________________________________________________________________________ _____________________________________________________________________________________________________________________________________  WHEN TO CALL YOUR DOCTOR: 1. Fever over 101.0 2. Nausea and/or vomiting. 3. Extreme swelling or bruising. 4. Continued bleeding from incision. 5. Increased pain, redness, or drainage from the incision.  The clinic staff is available to answer your questions during regular business hours.  Please dont hesitate to call and ask to speak to one of the nurses for clinical concerns.  If you have a medical emergency, go to the  nearest emergency room or call 911.  [facility] is always on call at the hospital.  For  further questions, please visit [email or web address]    Post Anesthesia Home Care Instructions  Activity: Get plenty of rest for the remainder of the day. A responsible adult should stay with you for 24 hours following the procedure.  For the next 24 hours, DO NOT: -Drive a car -Paediatric nurse -Drink alcoholic beverages -Take any medication unless instructed by your physician -Make any legal decisions or sign important papers.  Meals: Start with liquid foods such as gelatin or soup. Progress to regular foods as tolerated. Avoid greasy, spicy, heavy foods. If nausea and/or vomiting occur, drink only clear liquids until the nausea and/or vomiting subsides. Call your physician if vomiting continues.  Special Instructions/Symptoms: Your throat may feel dry or sore from the anesthesia or the breathing tube placed in your throat during surgery. If this causes discomfort, gargle with warm salt water. The discomfort should disappear within 24 hours.  If you had a scopolamine patch placed behind your ear for the management of post- operative nausea and/or vomiting:  1. The medication in the patch is effective for 72 hours, after which it should be removed.  Wrap patch in a tissue and discard in the trash. Wash hands thoroughly with soap and water. 2. You may remove the patch earlier than 72 hours if you experience unpleasant side effects which may include dry mouth, dizziness or visual disturbances. 3. Avoid touching the patch. Wash your hands with soap and water after contact with the patch.

## 2015-12-16 NOTE — Anesthesia Procedure Notes (Signed)
Procedure Name: LMA Insertion Date/Time: 12/16/2015 9:56 AM Performed by: Curly ShoresRAFT, Cynara Tatham W Pre-anesthesia Checklist: Patient identified, Emergency Drugs available, Suction available and Patient being monitored Patient Re-evaluated:Patient Re-evaluated prior to inductionOxygen Delivery Method: Circle system utilized Preoxygenation: Pre-oxygenation with 100% oxygen Intubation Type: IV induction Ventilation: Mask ventilation without difficulty LMA: LMA inserted LMA Size: 4.0 Number of attempts: 1 Airway Equipment and Method: Bite block Placement Confirmation: positive ETCO2 and breath sounds checked- equal and bilateral Tube secured with: Tape Dental Injury: Teeth and Oropharynx as per pre-operative assessment

## 2015-12-16 NOTE — H&P (View-Only) (Signed)
Destiny Mueller 11/18/2015 3:32 PM Location: [facility] Patient #: [id number] DOB: 12-07-1960 Married / Language: [place] / Race: White Female  History of Present Illness Destiny Mueller(Destiny Walthall J. Prabhjot Maddux MD; 11/18/2015 5:26 PM) The patient is a 55 year old female.   Note:She is referred by Dr. 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 She had a slightly atypical area on the mammogram a year ago. This year they're still a vague distortion upper outer quadrant right breast. Image guided biopsy was performed with the above pathology. Wider excision is recommended and so she's been sent here to discuss that. There is no family history of breast or ovarian cancer. No palpable masses. No nipple discharge or breast pain.  Other Problems Destiny Mueller(Destiny Mueller, CMA; 11/18/2015 3:34 PM) Anxiety Disorder Thyroid Disease  Past Surgical History Destiny Mueller(Destiny Mueller, CMA; 11/18/2015 3:34 PM) Oral Surgery  Diagnostic Studies History Destiny Mueller(Destiny [place]; 11/18/2015 3:34 PM) Colonoscopy 5-10 years ago Mammogram within last year Pap Smear 1-5 years ago  Allergies Destiny Mueller(Destiny Mueller, CMA; 11/18/2015 3:34 PM) Codeine Sulfate *ANALGESICS - OPIOID*  Medication History Destiny Mueller(Destiny Mueller, CMA; 11/18/2015 3:34 PM) ALPRAZolam (0.5MG  Tablet, Oral) Active. Citalopram Hydrobromide (40MG  Tablet, Oral) Active. Levothyroxine Sodium (75MCG Tablet, Oral) Active. Medications Reconciled  Social History Destiny Mueller(Destiny [place], [place]; 11/18/2015 3:34 PM) Alcohol use Moderate alcohol use. Caffeine use Coffee. No drug use Tobacco use Former smoker.  Family History Destiny Mueller(Destiny [place]; 11/18/2015 3:34 PM) Hypertension Mother. Thyroid problems Mother, Sister.  Pregnancy / Birth History Destiny Mueller(Destiny [place]; 11/18/2015 3:34 PM) Age at menarche 12 years. Age of menopause 50-55 Contraceptive History Oral contraceptives. Gravida 1 Maternal age 56-35 Para 1    Vitals Destiny Mueller(Destiny Mueller  CMA; 11/18/2015 3:35 PM) 11/18/2015 3:34 PM Weight: 132 lb Height: 68in Body Surface Area: 1.71 m Body Mass Index: 20.07 kg/m  Temp.: 98.62F  Pulse: 69 (Regular)  BP: 128/78 (Sitting, Left Arm, Standard)      Physical Exam Destiny Mueller(Destiny Mueller J. Kelie Gainey MD; 11/18/2015 5:27 PM)  The physical exam findings are as follows: Note:General: Thin female in NAD. Pleasant and cooperative.  HEENT: [place]/AT, no facial masses  EYES: EOMI, no icterus  NECK: Supple, no obvious mass.  CV: RRR, no murmur, no JVD.  CHEST: Breath sounds equal and clear. Respirations nonlabored.  BREASTS: Symmetrical in size. No dominant masses, nipple discharge or suspicious skin lesions. Small needle hole and ecchymosis in right breast  ABDOMEN: Soft, nontender, nondistended.  LYMPHATIC: No palpable cervical, supraclavicular, axillary adenopathy.  SKIN: No jaundice or suspicious rashes.  NEUROLOGIC: Alert and oriented, answers questions appropriately, normal gait and station.  PSYCHIATRIC: Normal mood, affect , and behavior.    Assessment & Plan Destiny Mueller(Destiny Mueller J. 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 We talked about the increased risk of there being a neoplastic process in this area and the recommendation for wider excision. She would like to proceed with that.  Plan: Right breast lumpectomy after radioactive seed localization. I have explained the procedure, risks, and aftercare to her. Risks include but are not limited to bleeding, infection, wound problems, seroma formation, cosmetic deformity, anesthesia. She seems to [facility] and agrees with the plan.  Destiny Peaceodd Keeana Pieratt, MD

## 2015-12-17 ENCOUNTER — Encounter (HOSPITAL_BASED_OUTPATIENT_CLINIC_OR_DEPARTMENT_OTHER): Payer: Self-pay | Admitting: General Surgery

## 2016-03-01 ENCOUNTER — Telehealth: Payer: Self-pay | Admitting: Family Medicine

## 2016-03-01 NOTE — Telephone Encounter (Signed)
Patient Name: Destiny Mueller  DOB: 12/26/1960    Initial Comment [name] states c/o itching.    Nurse Assessment  Nurse: Stefano GaulStringer, RN, Dwana CurdVera Date/Time (Eastern Time): 03/01/2016 8:55:59 AM  Confirm and document reason for call. If symptomatic, describe symptoms. You must click the next button to save text entered. ---[name] states she is itching. Has been itching for 3 weeks. Itching is intermittent and occurs in different parts of her body. No rash. she has used benadryl and hydrocortisone that does not help. no dry skin. Itching has gotten itching.  Has the patient traveled out of the country within the last 30 days? ---Not Applicable  Does the patient have any new or worsening symptoms? ---Yes  Will a triage be completed? ---Yes  Related visit to physician within the last 2 weeks? ---No  Does the PT have any chronic conditions? (i.e. diabetes, asthma, etc.) ---Yes  List chronic conditions. ---thyroid problems  Is this a behavioral health or substance abuse call? ---No     Guidelines    Guideline Title Affirmed Question Affirmed Notes  Itching - Widespread [1] Widespread itching AND [2] cause unknown AND [3] present > 48 hours (Exception: caller knows the cause and can eliminate it)    Final Disposition User   See PCP When Office is Open (within 3 days) Stefano GaulStringer, RN, Dwana CurdVera    Comments  appt scheduled for 11 am on 03/02/16 at 11 am with Dr. Gershon CraneStephen Fry  itching has gotten worse   Referrals  REFERRED TO PCP OFFICE   Disagree/Comply: Comply

## 2016-03-02 ENCOUNTER — Encounter: Payer: Self-pay | Admitting: Family Medicine

## 2016-03-02 ENCOUNTER — Ambulatory Visit (INDEPENDENT_AMBULATORY_CARE_PROVIDER_SITE_OTHER): Payer: BC Managed Care – PPO | Admitting: Family Medicine

## 2016-03-02 VITALS — BP 95/66 | HR 63 | Temp 98.4°F | Ht 68.0 in | Wt 128.0 lb

## 2016-03-02 DIAGNOSIS — L509 Urticaria, unspecified: Secondary | ICD-10-CM | POA: Diagnosis not present

## 2016-03-02 DIAGNOSIS — F4321 Adjustment disorder with depressed mood: Secondary | ICD-10-CM | POA: Diagnosis not present

## 2016-03-02 MED ORDER — METHYLPREDNISOLONE 4 MG PO TBPK: ORAL_TABLET | ORAL | 0 refills | Status: DC

## 2016-03-02 MED ORDER — HYDROXYZINE HCL 25 MG PO TABS: 25.0000 mg | ORAL_TABLET | ORAL | 2 refills | Status: DC | PRN

## 2016-03-02 NOTE — Progress Notes (Signed)
   Subjective:    Patient ID: Destiny Mueller, female    DOB: [birth year], 55 y.o.   MRN: [id number]  HPI Here for 3 weeks of intermittent bumps under the skin on the arms or legs and also widespread itching. She is using Benadryl and cortisone creams with poor success. No SOB. No new medications recently. She has been under a lot of stress the past 6 months. First her husband had a serious heart attack in the spring, and he seems to be doing well. Then she had surgery to remove a breast lump, and fortunately this turned out to be benign. Then 2 weeks ago her mother suddenly died of a heart attack. She has "tried to be strong" for her father and sisters, but she admits to not really dealing with the loss herself yet.    Review of Systems  Constitutional: Negative.   Respiratory: Negative.   Cardiovascular: Negative.   Neurological: Negative.   Psychiatric/Behavioral: Positive for dysphoric mood and sleep disturbance. Negative for agitation, confusion, decreased concentration, hallucinations, self-injury and suicidal ideas. The patient is nervous/anxious.        Objective:   Physical Exam  Constitutional: She is oriented to person, place, and time. She appears well-developed and well-nourished.  Cardiovascular: Normal rate, regular rhythm, normal heart sounds and intact distal pulses.   Pulmonary/Chest: Effort normal and breath sounds normal.  Neurological: She is alert and oriented to person, place, and time.  Psychiatric: Her behavior is normal. Thought content normal.  Tearful           Assessment & Plan:  She has hives and I think these are the direct result of stress. She has been trough a lot this year and she has some unresolved grief issues. We wil treat the hives with Zyrtec 10 mg bid, a Medrol dose pack, and Hydroxyzine prn. I recommended she speak with a therapist to help with the grieving process. Recheck prn.  Nelwyn SalisburyFRY,Adisa Vigeant A, MD

## 2016-03-02 NOTE — Progress Notes (Signed)
Pre visit review using our clinic review tool, if applicable. No additional management support is needed unless otherwise documented below in the visit note. 

## 2016-05-26 ENCOUNTER — Telehealth: Payer: Self-pay | Admitting: Family Medicine

## 2016-05-26 NOTE — Telephone Encounter (Signed)
Pt states she has what she thinks is a "bakers cyst". On the back of knee.  About the size of a golf ball.  Been there about a month. Would like to know if this is something Dr Clent RidgesFry could handle  (Aspirate) or does she need to see her dermatologist?

## 2016-05-27 NOTE — Telephone Encounter (Signed)
She will need to see Ortho, if she does not have one then she will need to schedule office visit with Dr. Clent RidgesFry before we can make a referral.

## 2016-05-27 NOTE — Telephone Encounter (Signed)
Left detailed message on personal voicemail, that Dr. Clent RidgesFry said, you will need to see Ortho, if you do not have one then you will need to schedule office visit with Dr. Clent RidgesFry before we can make a referral. Please call office to schedule appt if need to.

## 2018-11-23 ENCOUNTER — Encounter: Payer: Self-pay | Admitting: Family Medicine

## 2018-11-23 ENCOUNTER — Other Ambulatory Visit: Payer: Self-pay

## 2018-11-23 ENCOUNTER — Ambulatory Visit (INDEPENDENT_AMBULATORY_CARE_PROVIDER_SITE_OTHER): Payer: BC Managed Care – PPO | Admitting: Family Medicine

## 2018-11-23 DIAGNOSIS — B029 Zoster without complications: Secondary | ICD-10-CM | POA: Diagnosis not present

## 2018-11-23 MED ORDER — VALACYCLOVIR HCL 1 G PO TABS: 1000.0000 mg | ORAL_TABLET | Freq: Three times a day (TID) | ORAL | 0 refills | Status: DC

## 2018-11-23 MED ORDER — METHYLPREDNISOLONE 4 MG PO TBPK: ORAL_TABLET | ORAL | 0 refills | Status: DC

## 2018-11-23 NOTE — Progress Notes (Signed)
Subjective:    Patient ID: Destiny Mueller, female    DOB: 1960/12/08, 58 y.o.   MRN: [id number]  HPI Virtual Visit via Video Note  I connected with the patient on 11/23/18 at  9:45 AM EDT by a video enabled telemedicine application and verified that I am speaking with the correct person using two identifiers.  Location patient: home Location provider:work or home office Persons participating in the virtual visit: patient, provider  I discussed the limitations of evaluation and management by telemedicine and the availability of in person appointments. The patient expressed understanding and agreed to proceed.   HPI: Here for 3 days of a rash on the left side that itches and is slightly painful. Her skin in this area is ery sensitive to the touch. She had a mild headache when this started but not now.    ROS: See pertinent positives and negatives per HPI.  Past Medical History:  Diagnosis Date  . Anxiety   . Breast mass, right   . Complication of anesthesia    states her HR dropped after given drugs for colonoscopy  . Depression    sees Dr. Jennette Kettle (GYN)   . Hypothyroidism    sees Dr. Talmage Nap     Past Surgical History:  Procedure Laterality Date  . BREAST LUMPECTOMY WITH RADIOACTIVE SEED LOCALIZATION Right 12/16/2015   Procedure: RIGHT BREAST LUMPECTOMY WITH RADIOACTIVE SEED LOCALIZATION;  Surgeon: Avel Peace, MD;  Location: [facility];  Service: General;  Laterality: Right;  . COLONOSCOPY      Family History  Problem Relation Age of Onset  . Hypothyroidism Mother      Current Outpatient Medications:  .  ALPRAZolam (XANAX) 0.5 MG tablet, Take 0.5 mg by mouth at bedtime., Disp: , Rfl:  .  citalopram (CELEXA) 40 MG tablet, Take 40 mg by mouth daily., Disp: , Rfl:  .  hydrOXYzine (ATARAX/VISTARIL) 25 MG tablet, Take 1 tablet (25 mg total) by mouth every 4 (four) hours as needed for itching., Disp: 60 tablet, Rfl: 2 .  levothyroxine (SYNTHROID,  LEVOTHROID) 75 MCG tablet, Take 75 mcg by mouth daily before breakfast., Disp: , Rfl:   EXAM: SKIN: there is a band of red papules and vesicles on the left side below the breast that runs from the sternum to the spine  VITALS per patient if applicable:  GENERAL: alert, oriented, appears well and in no acute distress  HEENT: atraumatic, conjunttiva clear, no obvious abnormalities on inspection of external nose and ears  NECK: normal movements of the head and neck  LUNGS: on inspection no signs of respiratory distress, breathing rate appears normal, no obvious gross SOB, gasping or wheezing  CV: no obvious cyanosis  MS: moves all visible extremities without noticeable abnormality  PSYCH/NEURO: pleasant and cooperative, no obvious depression or anxiety, speech and thought processing grossly intact  ASSESSMENT AND PLAN: This is shingles, treat with Valtrex and a Medrol dose pack.  Gershon Crane, MD  Discussed the following assessment and plan:  No diagnosis found.     I discussed the assessment and treatment plan with the patient. The patient was provided an opportunity to ask questions and all were answered. The patient agreed with the plan and demonstrated an understanding of the instructions.   The patient was advised to call back or seek an in-person evaluation if the symptoms worsen or if the condition fails to improve as anticipated.     Review of Systems     Objective:  Physical Exam        Assessment & Plan:

## 2019-02-19 ENCOUNTER — Telehealth: Payer: Self-pay | Admitting: Family Medicine

## 2019-02-19 ENCOUNTER — Encounter: Payer: Self-pay | Admitting: Family Medicine

## 2019-02-19 NOTE — Telephone Encounter (Signed)
The letter is ready  

## 2019-02-20 ENCOUNTER — Encounter: Payer: Self-pay | Admitting: Family Medicine

## 2019-02-20 NOTE — Telephone Encounter (Signed)
Letter has been sent to pt Mychart. Pt stated she has received it. No further action needed!

## 2019-04-19 ENCOUNTER — Other Ambulatory Visit: Payer: Self-pay

## 2019-04-19 DIAGNOSIS — Z20822 Contact with and (suspected) exposure to covid-19: Secondary | ICD-10-CM

## 2019-04-21 LAB — NOVEL CORONAVIRUS, NAA: SARS-CoV-2, NAA: NOT DETECTED

## 2019-08-03 ENCOUNTER — Ambulatory Visit: Payer: BC Managed Care – PPO | Attending: Internal Medicine

## 2019-08-03 DIAGNOSIS — Z23 Encounter for immunization: Secondary | ICD-10-CM | POA: Insufficient documentation

## 2019-08-03 NOTE — Progress Notes (Signed)
   Covid-19 Vaccination Clinic  Name:  Destiny Mueller    MRN: [id number] DOB: 04-22-1961  08/03/2019  Destiny Mueller was observed post Covid-19 immunization for 15 minutes without incidence. She was provided with Vaccine Information Sheet and instruction to access the V-Safe system.   [name] was instructed to call 911 with any severe reactions post vaccine: Marland Kitchen Difficulty breathing  . Swelling of your face and throat  . A fast heartbeat  . A bad rash all over your body  . Dizziness and weakness    Immunizations Administered    Name Date Dose VIS Date Route   Pfizer COVID-19 Vaccine 08/03/2019  2:35 PM 0.3 mL 06/22/2019 Intramuscular   Manufacturer: [employer], [employer]   Lot: [id number]   NDC: [address]-9

## 2019-08-24 ENCOUNTER — Ambulatory Visit: Payer: BC Managed Care – PPO | Attending: Internal Medicine

## 2019-08-24 DIAGNOSIS — Z23 Encounter for immunization: Secondary | ICD-10-CM | POA: Insufficient documentation

## 2019-08-24 NOTE — Progress Notes (Signed)
   Covid-19 Vaccination Clinic  Name:  Destiny Mueller    MRN: [id number] DOB: 27-Jan-1961  08/24/2019  [name] was observed post Covid-19 immunization for 15 minutes without incidence. She was provided with Vaccine Information Sheet and instruction to access the V-Safe system.   [name] was instructed to call 911 with any severe reactions post vaccine: Marland Kitchen Difficulty breathing  . Swelling of your face and throat  . A fast heartbeat  . A bad rash all over your body  . Dizziness and weakness    Immunizations Administered    Name Date Dose VIS Date Route   Pfizer COVID-19 Vaccine 08/24/2019 11:26 AM 0.3 mL 06/22/2019 Intramuscular   Manufacturer: [employer], [employer]   Lot: [id number]   NDC: [address]-9

## 2019-12-17 ENCOUNTER — Other Ambulatory Visit: Payer: Self-pay | Admitting: Obstetrics & Gynecology

## 2019-12-17 DIAGNOSIS — R928 Other abnormal and inconclusive findings on diagnostic imaging of breast: Secondary | ICD-10-CM

## 2019-12-18 ENCOUNTER — Other Ambulatory Visit: Payer: Self-pay | Admitting: Obstetrics & Gynecology

## 2019-12-18 ENCOUNTER — Other Ambulatory Visit: Payer: Self-pay

## 2019-12-18 ENCOUNTER — Ambulatory Visit
Admission: RE | Admit: 2019-12-18 | Discharge: 2019-12-18 | Disposition: A | Discharge status: Home / Self Care | Payer: BC Managed Care – PPO | Source: Ambulatory Visit | Attending: Obstetrics & Gynecology | Admitting: Obstetrics & Gynecology

## 2019-12-18 DIAGNOSIS — R928 Other abnormal and inconclusive findings on diagnostic imaging of breast: Secondary | ICD-10-CM

## 2019-12-20 ENCOUNTER — Other Ambulatory Visit: Payer: Self-pay

## 2019-12-20 ENCOUNTER — Ambulatory Visit
Admission: RE | Admit: 2019-12-20 | Discharge: 2019-12-20 | Disposition: A | Discharge status: Home / Self Care | Payer: BC Managed Care – PPO | Source: Ambulatory Visit | Attending: Obstetrics & Gynecology | Admitting: Obstetrics & Gynecology

## 2019-12-20 DIAGNOSIS — R928 Other abnormal and inconclusive findings on diagnostic imaging of breast: Secondary | ICD-10-CM

## 2020-02-27 ENCOUNTER — Other Ambulatory Visit: Payer: Self-pay | Admitting: Critical Care Medicine

## 2020-02-27 ENCOUNTER — Other Ambulatory Visit: Payer: BC Managed Care – PPO

## 2020-02-27 DIAGNOSIS — Z20822 Contact with and (suspected) exposure to covid-19: Secondary | ICD-10-CM

## 2020-02-28 LAB — SARS-COV-2, NAA 2 DAY TAT

## 2020-02-28 LAB — NOVEL CORONAVIRUS, NAA: SARS-CoV-2, NAA: NOT DETECTED

## 2020-03-02 ENCOUNTER — Encounter: Payer: Self-pay | Admitting: Family Medicine

## 2020-03-04 NOTE — Telephone Encounter (Signed)
Noted  

## 2020-03-05 ENCOUNTER — Telehealth: Payer: Self-pay | Admitting: Family Medicine

## 2020-03-05 NOTE — Telephone Encounter (Signed)
Please advise 

## 2020-03-05 NOTE — Telephone Encounter (Signed)
Patient tested positive for Covid on Wednesday.  She is complaining of a headache and stopped up nose.  She is wanting to know if there is something that she can take or Dr Clent Ridges can call in for her.

## 2020-03-06 ENCOUNTER — Telehealth: Payer: BC Managed Care – PPO | Admitting: Family Medicine

## 2020-03-07 NOTE — Telephone Encounter (Signed)
Set up a virtual OV so we can talk about this  

## 2020-03-11 NOTE — Telephone Encounter (Signed)
noted 

## 2020-11-03 IMAGING — US US BREAST BX W LOC DEV 1ST LESION IMG BX SPEC US GUIDE*L*
1 series · 13 of 16 positions shown · non-contrast
Comparison: Previous exam(s).
COMPARISON: Previous exam(s).

Addendum:
CLINICAL DATA: Ultrasound-guided core needle biopsy was recommended
of a mass in the 6 o'clock position of the left breast.

EXAM:
ULTRASOUND GUIDED LEFT BREAST CORE NEEDLE BIOPSY

[Series 1: us breast bx w loc dev 1st lesion img bx spec us g · 0.05mm/px · 13 of 16 slices shown]
[im 1/16]
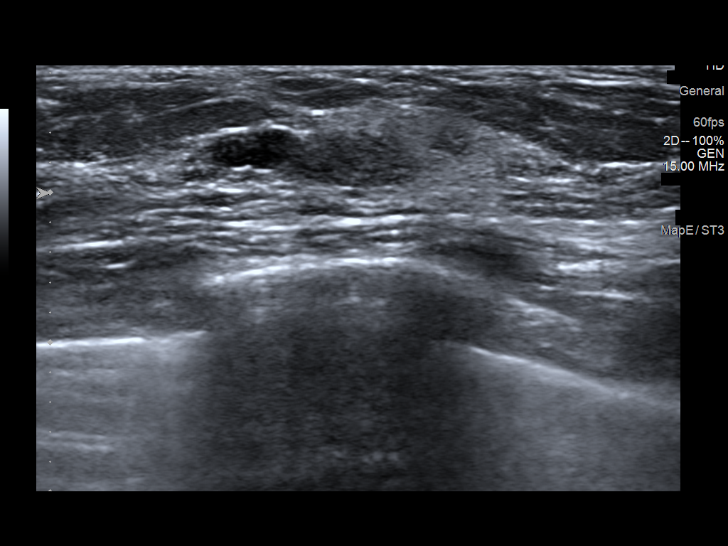
[im 2/16]
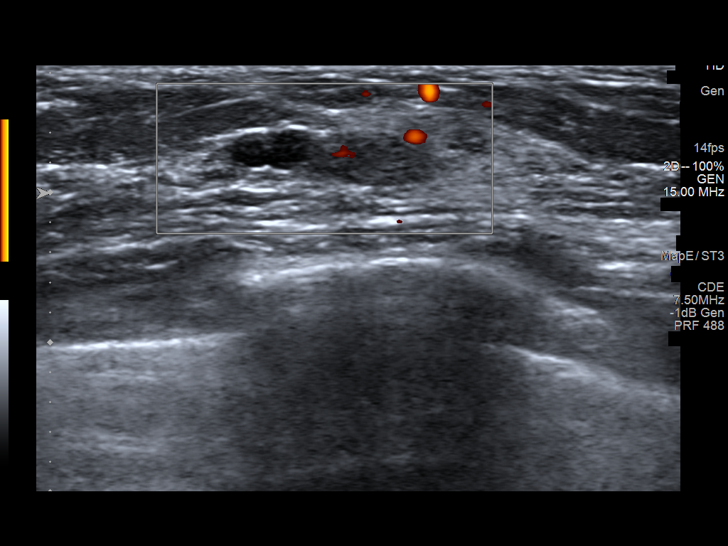
[im 4/16]
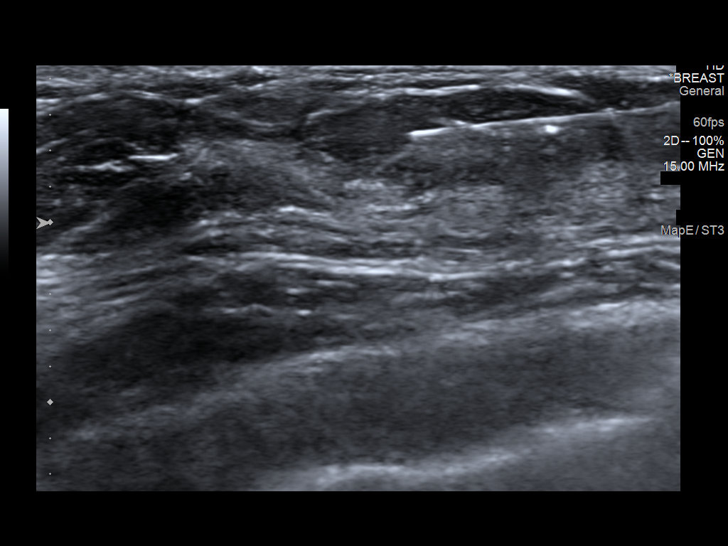
[im 5/16]
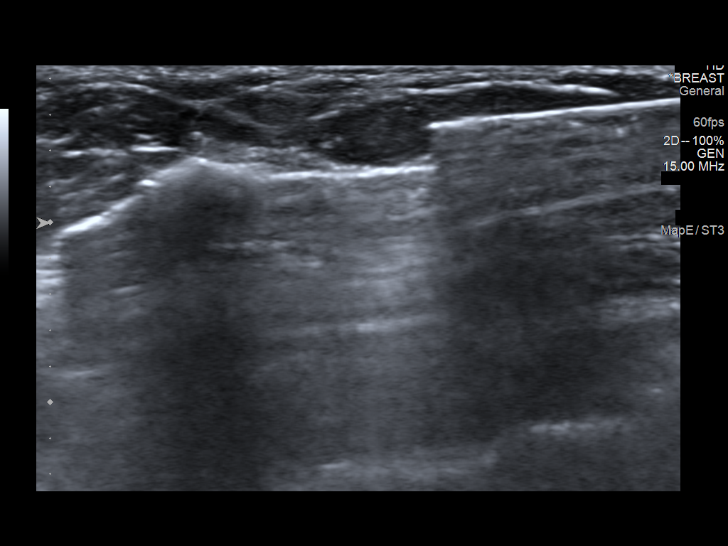
[im 6/16]
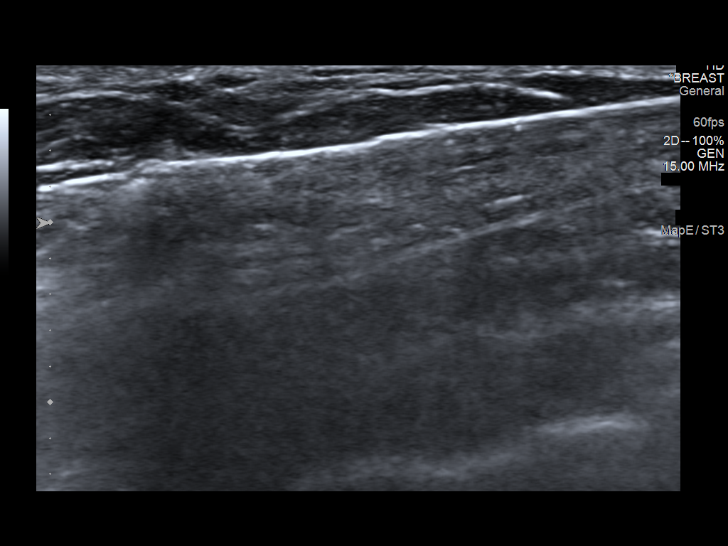
[im 7/16]
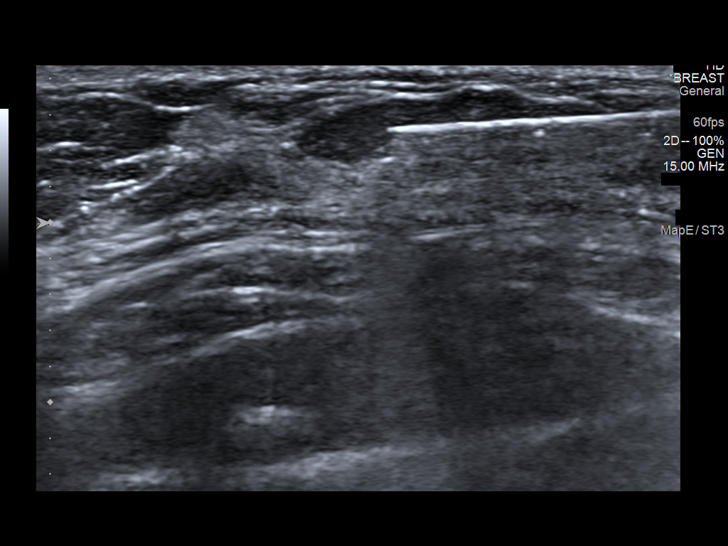
[im 9/16]
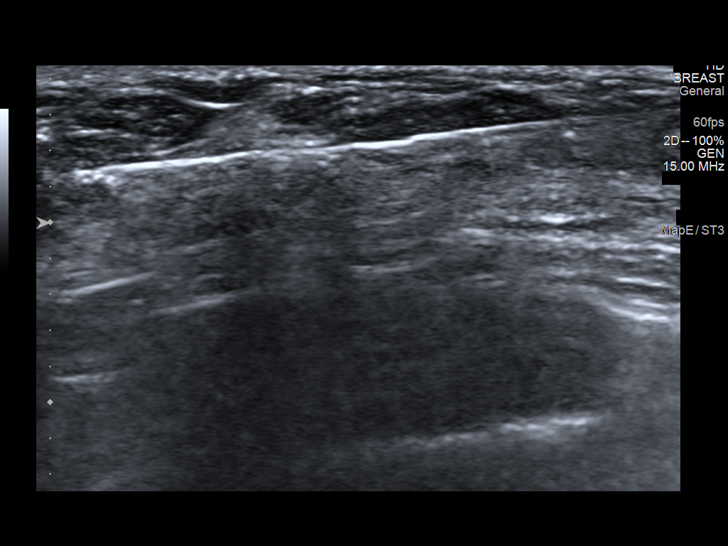
[im 10/16]
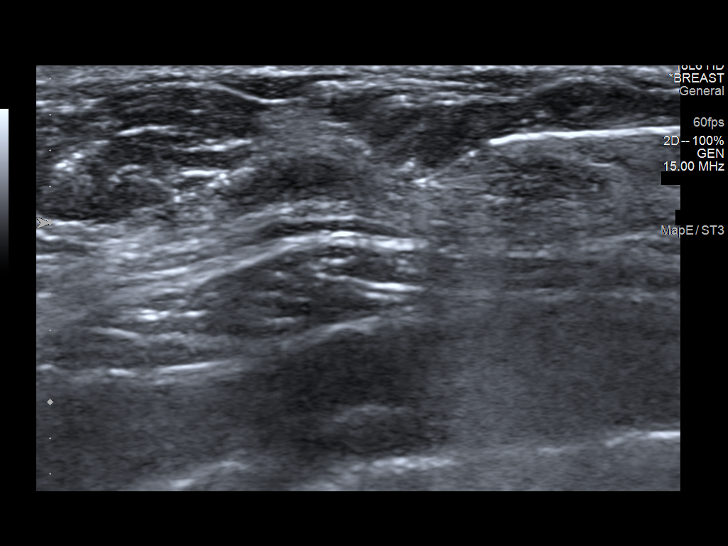
[im 11/16]
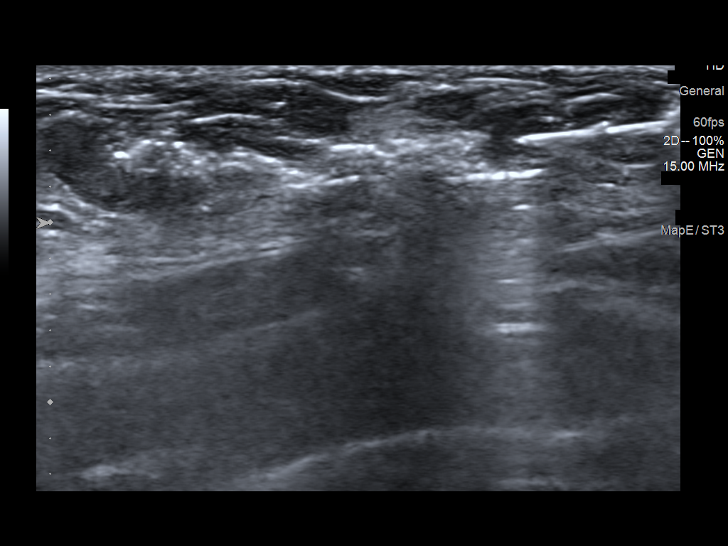
[im 12/16]
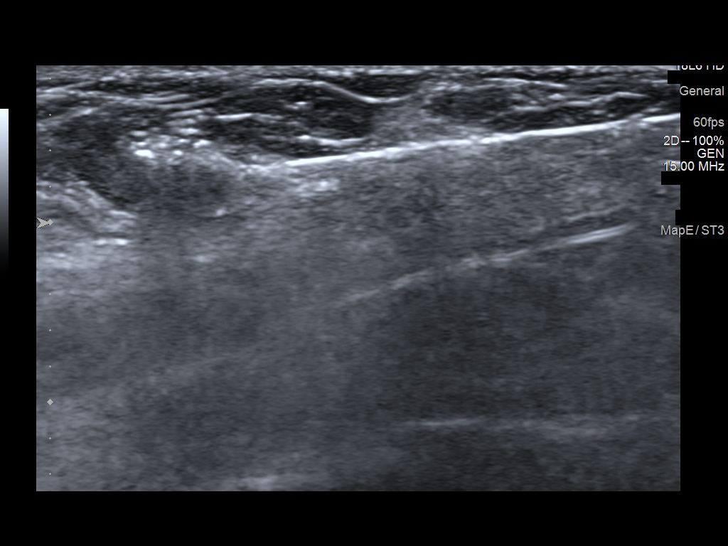
[im 13/16]
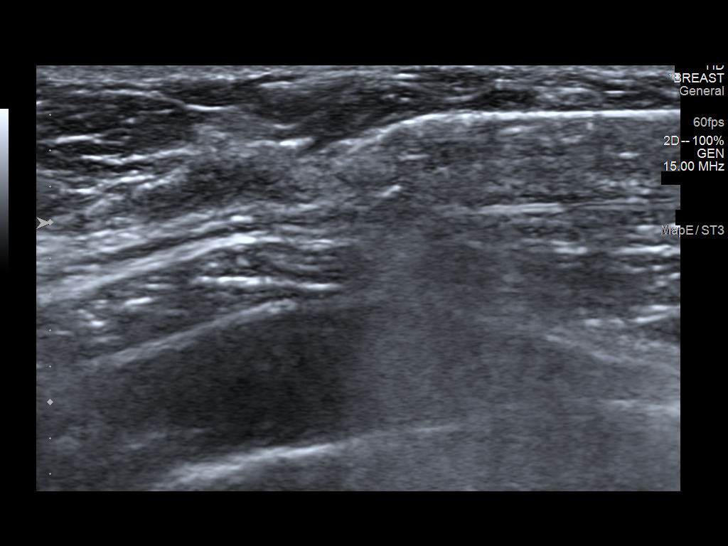
[im 15/16]
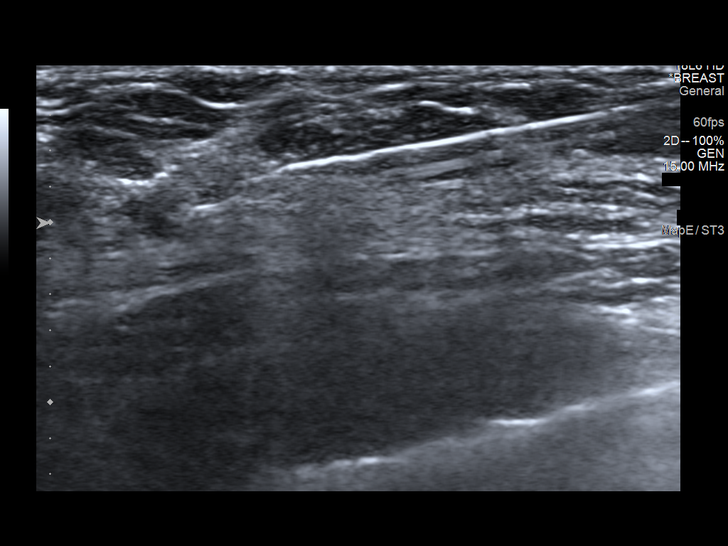
[im 16/16]
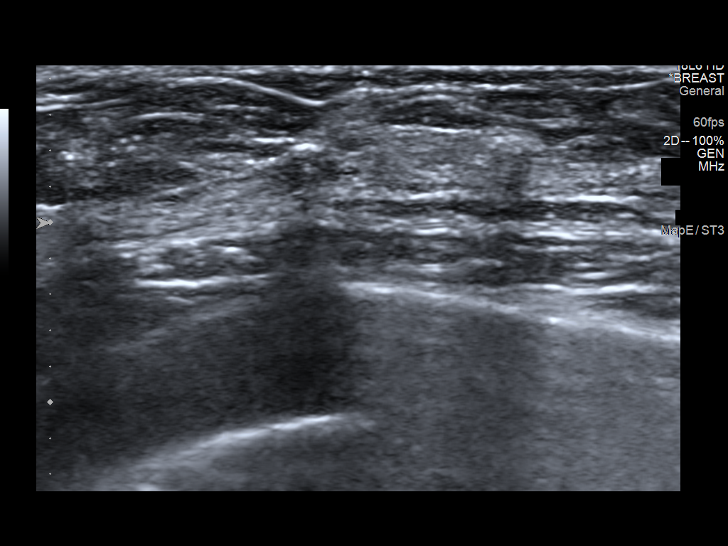

[13 of 16 positions shown; findings below may reference images not displayed]



Lesion quadrant: Lower outer quadrant

Using sterile technique and 1% Lidocaine as local anesthetic, under
direct ultrasound visualization, a 12 gauge [place] device was
used to perform biopsy of a 1.2 cm mass in the 6 o'clock position of
the left breast using a lateral approach. At the conclusion of the
procedure coil tissue marker clip was deployed into the biopsy
cavity. Follow up 2 view mammogram was performed and dictated
separately.
IMPRESSION: Ultrasound guided biopsy of the left breast. No apparent
complications.

ADDENDUM:
Pathology revealed FIBROCYSTIC CHANGE of the Left breast, 6 o'clock,
5cmfn. This was found to be concordant by Dr. Shinkumi Fozzy.

Pathology results were discussed with the patient by telephone. The
patient reported doing well after the biopsy with tenderness at the
site. Post biopsy instructions and care were reviewed and questions
were answered. The patient was encouraged to call The [REDACTED] for any additional concerns. My direct phone
number was provided for the patient.

The patient was instructed to return for annual screening

Pathology results reported by Nazareth Jumper, RN on 12/21/2019.



Lesion quadrant: Lower outer quadrant

Using sterile technique and 1% Lidocaine as local anesthetic, under
direct ultrasound visualization, a 12 gauge [place] device was
used to perform biopsy of a 1.2 cm mass in the 6 o'clock position of
the left breast using a lateral approach. At the conclusion of the
procedure coil tissue marker clip was deployed into the biopsy
cavity. Follow up 2 view mammogram was performed and dictated
separately.
IMPRESSION: Ultrasound guided biopsy of the left breast. No apparent
complications.

## 2020-11-03 IMAGING — MG MM BREAST LOCALIZATION CLIP
4 series · 4 of 12 positions shown · non-contrast
Comparison: Previous exam(s).

CLINICAL DATA: Ultrasound-guided biopsy was performed of a mass in
6 o'clock position of the left breast today.

EXAM:
DIAGNOSTIC LEFT MAMMOGRAM POST ULTRASOUND BIOPSY

[L ML synth-2D]
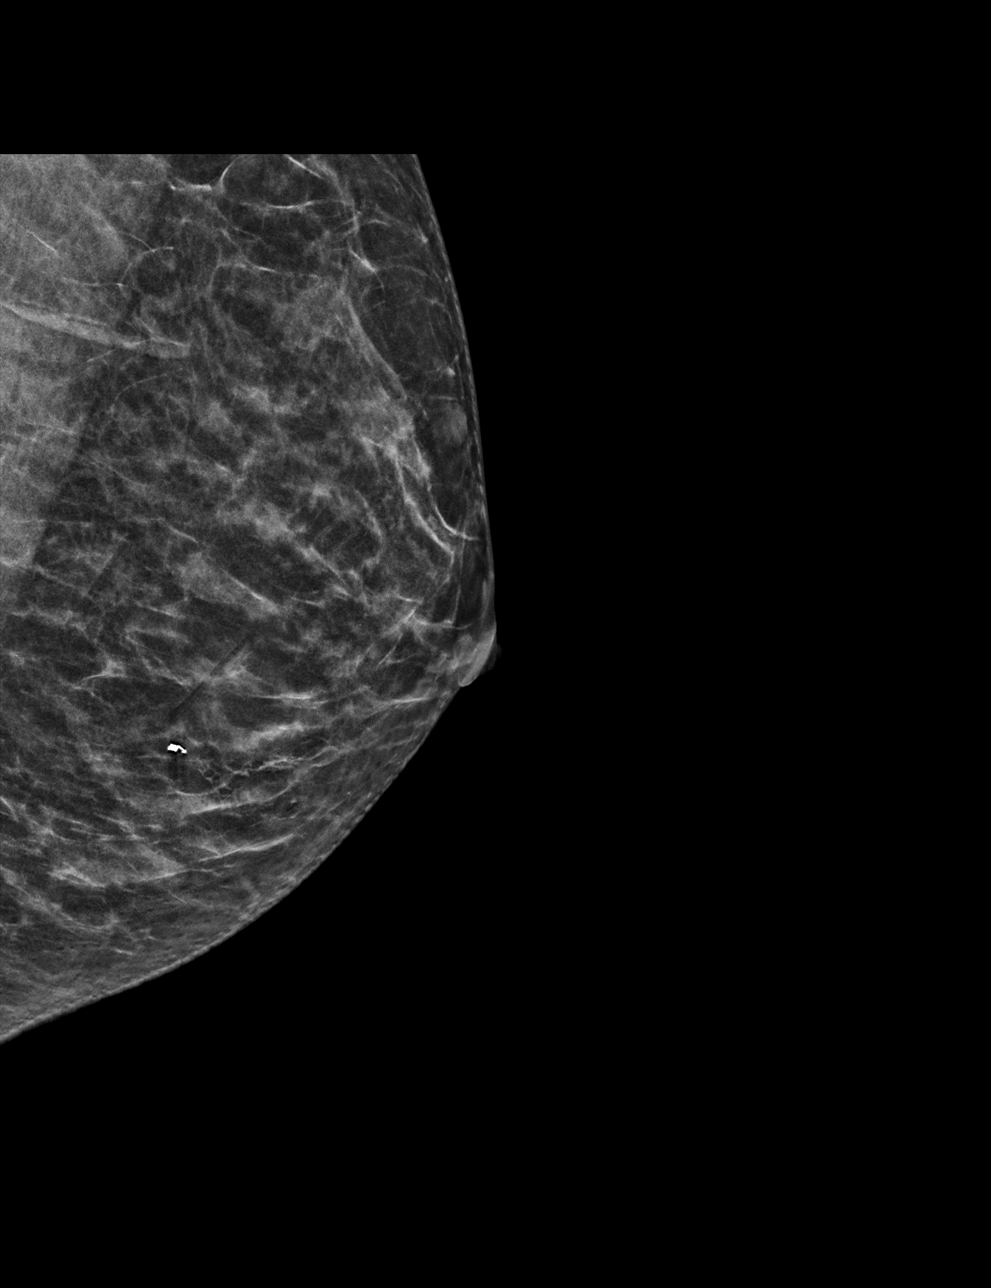

[L CC synth-2D]
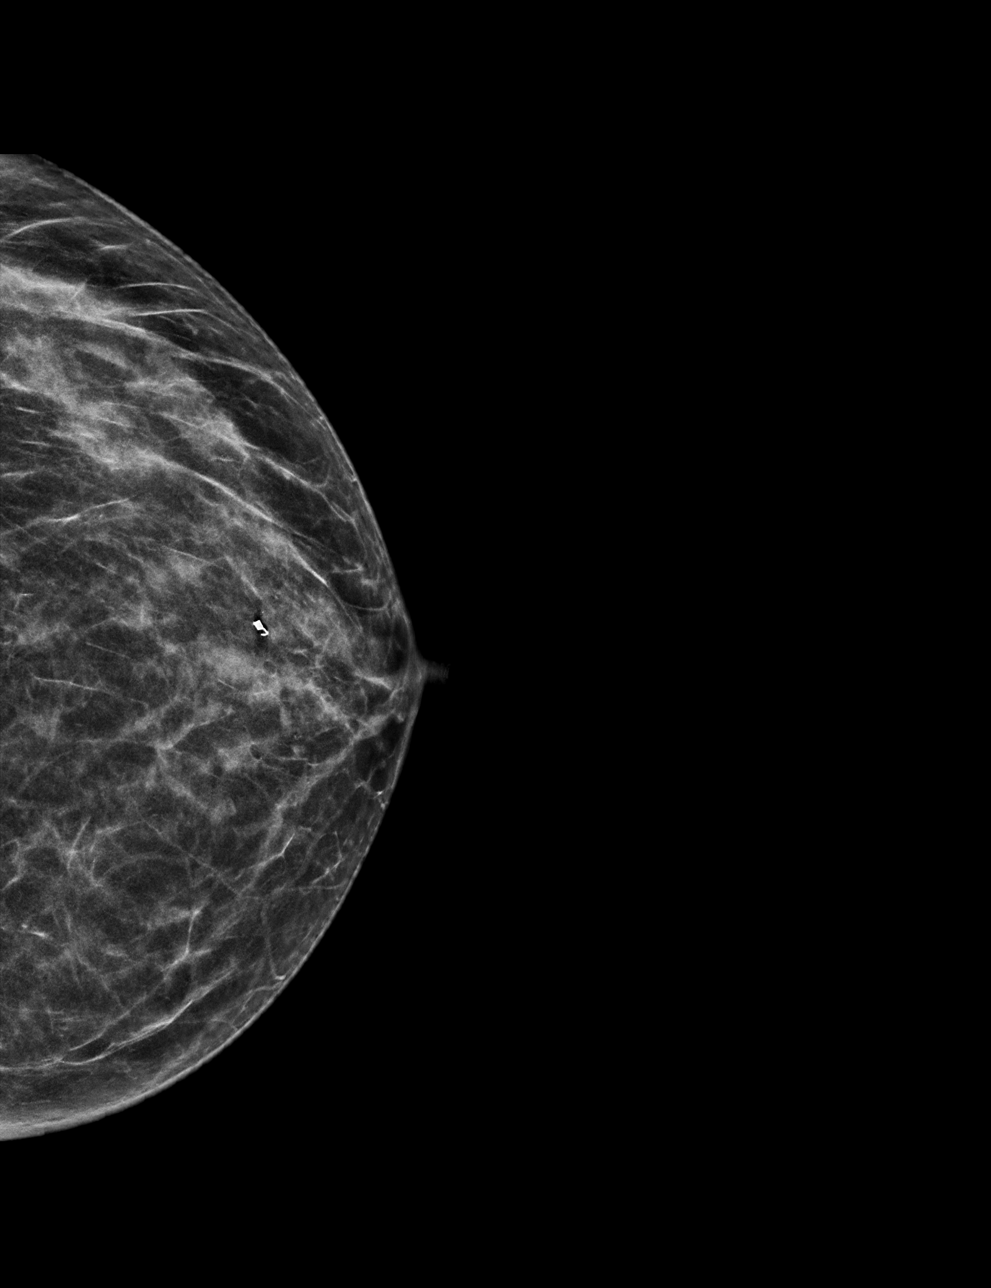

[L ML tomo · tomo slice 22/43.0]
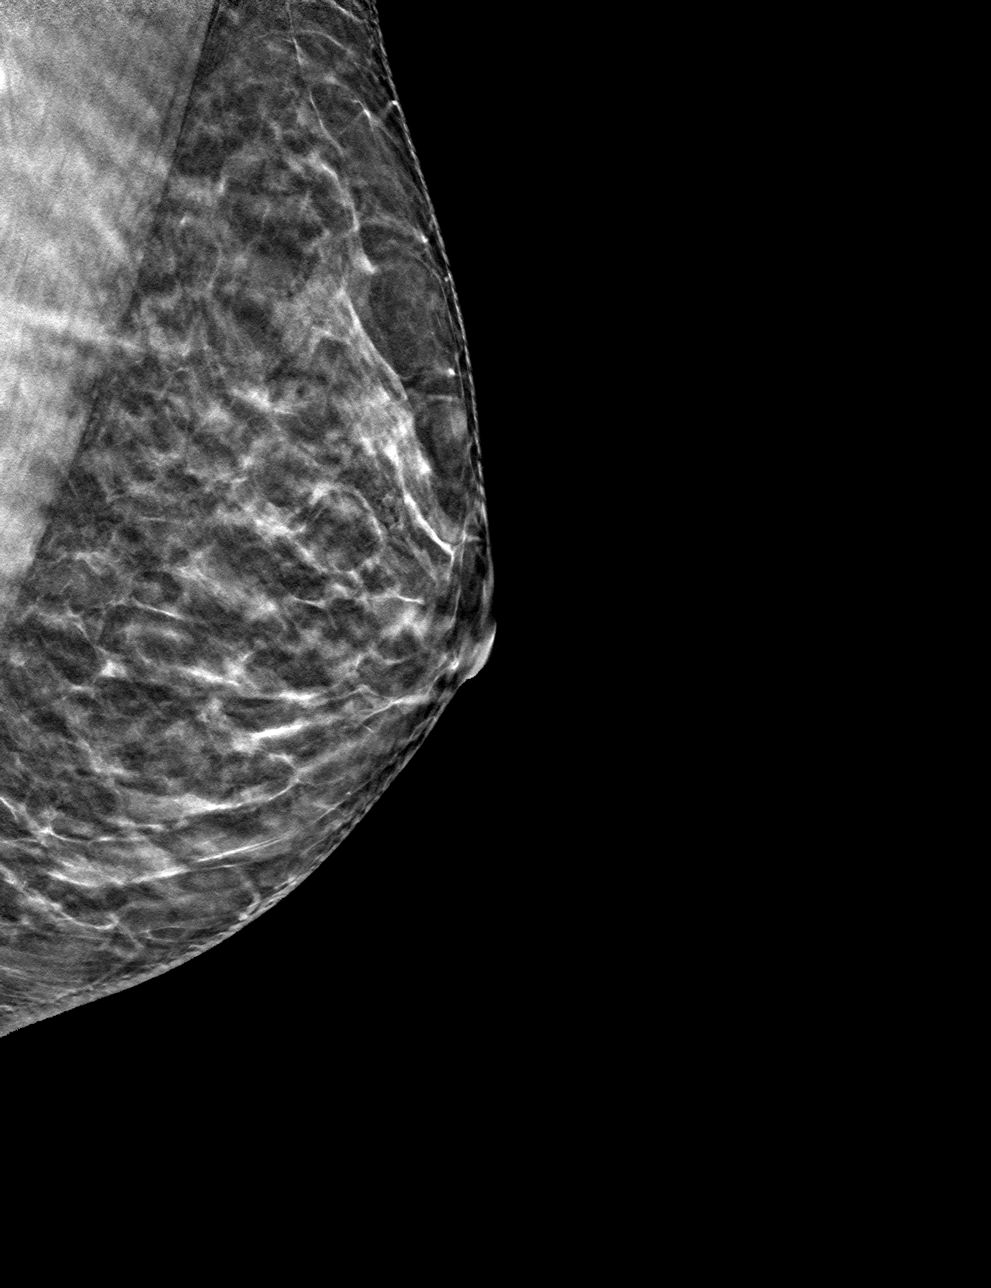

[L CC tomo · tomo slice 25/50.0]
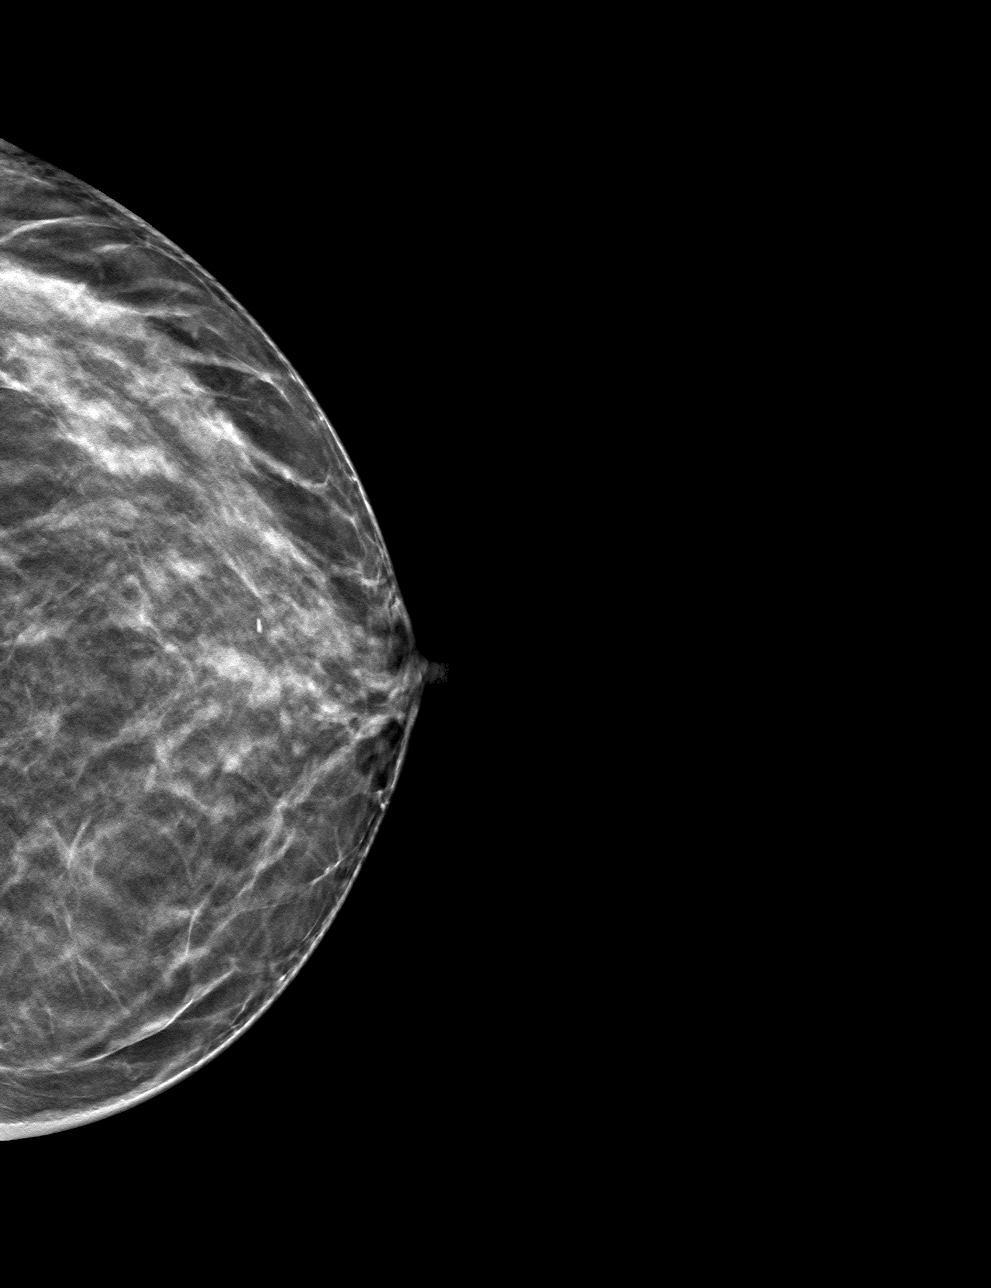

[4 of 12 positions shown; findings below may reference images not displayed]

FINDINGS: Mammographic images were obtained following ultrasound guided biopsy
of mass in the 6 o'clock position left breast 2 cm from the nipple.
The biopsy marking clip is in expected position at the site of
biopsy.
IMPRESSION: Appropriate positioning of the coil shaped biopsy marking clip at
the site of biopsy in the 6 o'clock position.

Final Assessment: Post Procedure Mammograms for Marker Placement

## 2021-03-04 ENCOUNTER — Other Ambulatory Visit: Payer: Self-pay

## 2021-03-04 ENCOUNTER — Encounter: Payer: Self-pay | Admitting: Family Medicine

## 2021-03-04 ENCOUNTER — Ambulatory Visit (INDEPENDENT_AMBULATORY_CARE_PROVIDER_SITE_OTHER): Payer: BC Managed Care – PPO | Admitting: Family Medicine

## 2021-03-04 VITALS — BP 102/80 | HR 66 | Temp 98.8°F | Wt 130.4 lb

## 2021-03-04 DIAGNOSIS — G44229 Chronic tension-type headache, not intractable: Secondary | ICD-10-CM

## 2021-03-04 DIAGNOSIS — F418 Other specified anxiety disorders: Secondary | ICD-10-CM

## 2021-03-04 NOTE — Progress Notes (Signed)
   Subjective:    Patient ID: Destiny Mueller, female    DOB: 09-02-1960, 60 y.o.   MRN: [id number]  HPI Here for daily headaches that started about one year ago. These are mild and she rarely takes any medication for them. They are centered in the forehead and also in the posterior head and posterior neck. They usually start in the afternoons, but sometimes she will wake up with one. She has had migraines in the past but she says these headaches are very different. They do not cause nausea or light sensitivity. Her BP is stable. She uses Sudafed, Zyrtec, and Flonase for her allergies, and these seem to be stable. She has been on Paxil and prn Xanax for years for anxiety. These had been prescribed by Dr. Jennette Kettle, her GYN. One year ago (about the time these headaches began) she started to cut the Paxil tablets in two to take 1/2 tab a day (or 20 mg).    Review of Systems  Constitutional: Negative.   Respiratory: Negative.    Cardiovascular: Negative.   Neurological:  Positive for headaches.  Psychiatric/Behavioral:  Negative for dysphoric mood. The patient is not nervous/anxious.       Objective:   Physical Exam Constitutional:      Appearance: Normal appearance.  Cardiovascular:     Rate and Rhythm: Normal rate and regular rhythm.     Pulses: Normal pulses.     Heart sounds: Normal heart sounds.  Pulmonary:     Effort: Pulmonary effort is normal.     Breath sounds: Normal breath sounds.  Musculoskeletal:     Cervical back: No rigidity or tenderness.  Neurological:     General: No focal deficit present.     Mental Status: She is alert and oriented to person, place, and time.  Psychiatric:        Mood and Affect: Mood normal.        Behavior: Behavior normal.        Thought Content: Thought content normal.          Assessment & Plan:  Her headaches sound like tension headaches, and I explained how stress can set these off. We discussed exercise and neck stretches as ways to  prevent the headaches. She will also go back to taking a full 40 mg tablet of Celexa daily. Recheck as needed. Gershon Crane, MD

## 2022-03-09 ENCOUNTER — Encounter: Payer: Self-pay | Admitting: Family Medicine

## 2022-03-16 ENCOUNTER — Encounter: Payer: Self-pay | Admitting: Family Medicine

## 2022-03-16 ENCOUNTER — Ambulatory Visit (INDEPENDENT_AMBULATORY_CARE_PROVIDER_SITE_OTHER): Payer: BC Managed Care – PPO | Admitting: Family Medicine

## 2022-03-16 VITALS — BP 108/76 | HR 58 | Temp 98.7°F | Ht 67.0 in | Wt 131.2 lb

## 2022-03-16 DIAGNOSIS — Z23 Encounter for immunization: Secondary | ICD-10-CM | POA: Diagnosis not present

## 2022-03-16 DIAGNOSIS — Z0000 Encounter for general adult medical examination without abnormal findings: Secondary | ICD-10-CM | POA: Diagnosis not present

## 2022-03-16 LAB — CBC WITH DIFFERENTIAL/PLATELET
Basophils Absolute: 0 10*3/uL (ref 0.0–0.1)
Basophils Relative: 0.5 % (ref 0.0–3.0)
Eosinophils Absolute: 0.1 10*3/uL (ref 0.0–0.7)
Eosinophils Relative: 0.9 % (ref 0.0–5.0)
HCT: 37.3 % (ref 36.0–46.0)
Hemoglobin: 12.6 g/dL (ref 12.0–15.0)
Lymphocytes Relative: 40.9 % (ref 12.0–46.0)
Lymphs Abs: 2.5 10*3/uL (ref 0.7–4.0)
MCHC: 33.7 g/dL (ref 30.0–36.0)
MCV: 91.1 fl (ref 78.0–100.0)
Monocytes Absolute: 0.5 10*3/uL (ref 0.1–1.0)
Monocytes Relative: 7.7 % (ref 3.0–12.0)
Neutro Abs: 3.1 10*3/uL (ref 1.4–7.7)
Neutrophils Relative %: 50 % (ref 43.0–77.0)
Platelets: 172 10*3/uL (ref 150.0–400.0)
RBC: 4.1 Mil/uL (ref 3.87–5.11)
RDW: 13 % (ref 11.5–15.5)
WBC: 6.1 10*3/uL (ref 4.0–10.5)

## 2022-03-16 LAB — HEMOGLOBIN A1C: Hgb A1c MFr Bld: 5.8 % (ref 4.6–6.5)

## 2022-03-16 LAB — BASIC METABOLIC PANEL
BUN: 13 mg/dL (ref 6–23)
CO2: 29 mEq/L (ref 19–32)
Calcium: 9.6 mg/dL (ref 8.4–10.5)
Chloride: 103 mEq/L (ref 96–112)
Creatinine, Ser: 0.76 mg/dL (ref 0.40–1.20)
GFR: 83.99 mL/min (ref >60.00)
Glucose, Bld: 96 mg/dL (ref 70–99)
Potassium: 4.2 mEq/L (ref 3.5–5.1)
Sodium: 139 mEq/L (ref 135–145)

## 2022-03-16 LAB — HEPATIC FUNCTION PANEL
ALT: 10 U/L (ref 0–35)
AST: 21 U/L (ref 0–37)
Albumin: 4.3 g/dL (ref 3.5–5.2)
Alkaline Phosphatase: 55 U/L (ref 39–117)
Bilirubin, Direct: 0.2 mg/dL (ref 0.0–0.3)
Total Bilirubin: 0.8 mg/dL (ref 0.2–1.2)
Total Protein: 6.6 g/dL (ref 6.0–8.3)

## 2022-03-16 LAB — LIPID PANEL
Cholesterol: 177 mg/dL (ref 0–200)
HDL: 71.2 mg/dL (ref >39.00)
LDL Cholesterol: 86 mg/dL (ref 0–99)
NonHDL: 105.59
Total CHOL/HDL Ratio: 2
Triglycerides: 96 mg/dL (ref 0.0–149.0)
VLDL: 19.2 mg/dL (ref 0.0–40.0)

## 2022-03-16 MED ORDER — ALPRAZOLAM 0.5 MG PO TABS: 0.5000 mg | ORAL_TABLET | Freq: Every day | ORAL | 1 refills | Status: DC

## 2022-03-16 NOTE — Addendum Note (Signed)
Addended by: Carola Rhine on: 03/16/2022 02:06 PM   Modules accepted: Orders

## 2022-03-16 NOTE — Progress Notes (Signed)
   Subjective:    Patient ID: Destiny Mueller, female    DOB: Aug 05, 1960, 61 y.o.   MRN: [id number]  HPI Here for a well exam. She feels fine. She has taken Xanax at bedtime for years, and Dr. Jennette Kettle, her former GYN, used to prescribe this. She now asks if we can do this. She still sees Dr. Talmage Nap for thyroid care.    Review of Systems  Constitutional: Negative.   HENT: Negative.    Eyes: Negative.   Respiratory: Negative.    Cardiovascular: Negative.   Gastrointestinal: Negative.   Genitourinary:  Negative for decreased urine volume, difficulty urinating, dyspareunia, dysuria, enuresis, flank pain, frequency, hematuria, pelvic pain and urgency.  Musculoskeletal: Negative.   Skin: Negative.   Neurological: Negative.  Negative for headaches.  Psychiatric/Behavioral: Negative.         Objective:   Physical Exam Constitutional:      General: She is not in acute distress.    Appearance: Normal appearance. She is well-developed.  HENT:     Head: Normocephalic and atraumatic.     Right Ear: External ear normal.     Left Ear: External ear normal.     Nose: Nose normal.     Mouth/Throat:     Pharynx: No oropharyngeal exudate.  Eyes:     General: No scleral icterus.    Conjunctiva/sclera: Conjunctivae normal.     Pupils: Pupils are equal, round, and reactive to light.  Neck:     Thyroid: No thyromegaly.     Vascular: No JVD.  Cardiovascular:     Rate and Rhythm: Normal rate and regular rhythm.     Heart sounds: Normal heart sounds. No murmur heard.    No friction rub. No gallop.  Pulmonary:     Effort: Pulmonary effort is normal. No respiratory distress.     Breath sounds: Normal breath sounds. No wheezing or rales.  Chest:     Chest wall: No tenderness.  Abdominal:     General: Bowel sounds are normal. There is no distension.     Palpations: Abdomen is soft. There is no mass.     Tenderness: There is no abdominal tenderness. There is no guarding or rebound.   Musculoskeletal:        General: No tenderness. Normal range of motion.     Cervical back: Normal range of motion and neck supple.  Lymphadenopathy:     Cervical: No cervical adenopathy.  Skin:    General: Skin is warm and dry.     Findings: No erythema or rash.  Neurological:     Mental Status: She is alert and oriented to person, place, and time.     Cranial Nerves: No cranial nerve deficit.     Motor: No abnormal muscle tone.     Coordination: Coordination normal.     Deep Tendon Reflexes: Reflexes are normal and symmetric. Reflexes normal.  Psychiatric:        Behavior: Behavior normal.        Thought Content: Thought content normal.        Judgment: Judgment normal.           Assessment & Plan:  Well exam. We discussed diet and exercise. Get fasting labs. Given a TDaP and a shingles vaccine. We will set her up for another colonoscopy.  Gershon Crane, MD

## 2022-05-17 ENCOUNTER — Ambulatory Visit (INDEPENDENT_AMBULATORY_CARE_PROVIDER_SITE_OTHER): Payer: BC Managed Care – PPO

## 2022-05-17 DIAGNOSIS — Z23 Encounter for immunization: Secondary | ICD-10-CM

## 2022-07-16 ENCOUNTER — Encounter: Payer: Self-pay | Admitting: Family Medicine

## 2022-07-16 ENCOUNTER — Ambulatory Visit: Payer: BC Managed Care – PPO | Admitting: Family Medicine

## 2022-07-16 VITALS — BP 96/66 | HR 59 | Temp 98.7°F | Wt 134.8 lb

## 2022-07-16 DIAGNOSIS — G8929 Other chronic pain: Secondary | ICD-10-CM | POA: Diagnosis not present

## 2022-07-16 DIAGNOSIS — M25511 Pain in right shoulder: Secondary | ICD-10-CM | POA: Diagnosis not present

## 2022-07-16 NOTE — Progress Notes (Signed)
   Subjective:    Patient ID: Destiny Mueller, female    DOB: 08/20/1960, 62 y.o.   MRN: [id number]  HPI Here for right shoulder pain for the past 2 months. No specific trauma history, but she has been working out hard at the gym several days a week. Part of these workouts involve lifting weights above her head and pushups. She has no pain when the arm is by her side, but she has a lot of pain when raising the arm above horizontal or when reaching behind her. She takes Ibuprofen 400 mg as needed.    Review of Systems  Constitutional: Negative.   Respiratory: Negative.    Cardiovascular: Negative.   Musculoskeletal:  Positive for arthralgias.       Objective:   Physical Exam Constitutional:      General: She is not in acute distress.    Appearance: Normal appearance.  Cardiovascular:     Rate and Rhythm: Normal rate and regular rhythm.     Pulses: Normal pulses.     Heart sounds: Normal heart sounds.  Pulmonary:     Effort: Pulmonary effort is normal.     Breath sounds: Normal breath sounds.  Musculoskeletal:     Comments: The right shoulder is not tender. She has full ROM but she has pain with rasing the right arm above her head, and with internal/external rotation   Neurological:     Mental Status: She is alert.           Assessment & Plan:  Right rotator cuff tendonitis. I stressed to her that the only way this will heal is to rest the shoulder. She will avoid any heavy lifting or any reaching above her head with the right arm for 30 days. She can take Ibuprofen as needed. Recheck as needed.  Alysia Penna, MD

## 2022-07-19 ENCOUNTER — Encounter: Payer: Self-pay | Admitting: Gastroenterology

## 2022-08-23 ENCOUNTER — Encounter: Payer: Self-pay | Admitting: Family Medicine

## 2022-08-23 DIAGNOSIS — G40909 Epilepsy, unspecified, not intractable, without status epilepticus: Secondary | ICD-10-CM

## 2022-08-24 NOTE — Telephone Encounter (Signed)
Calling back to see if she could get a referral to Ellouise Newer (neurologist) [facility]

## 2022-08-26 NOTE — Telephone Encounter (Signed)
I did the referral 

## 2022-08-26 NOTE — Addendum Note (Signed)
Addended by: Alysia Penna A on: 08/26/2022 12:50 PM   Modules accepted: Orders

## 2022-08-27 ENCOUNTER — Telehealth: Payer: Self-pay | Admitting: *Deleted

## 2022-08-27 ENCOUNTER — Ambulatory Visit: Payer: BC Managed Care – PPO

## 2022-08-27 NOTE — Telephone Encounter (Signed)
Pt desided to cancel colonoscopy due to recent seizure activity. Informed MD of issue

## 2022-08-27 NOTE — Progress Notes (Unsigned)
No egg or soy allergy known to patient  No issues known to pt with past sedation with any surgeries or procedures Patient denies ever being told they had issues or difficulty with intubation  No FH of Malignant Hyperthermia Pt is not on diet pills Pt is not on  home 02  Pt is not on blood thinners  Pt denies issues with constipation  Pt is not on dialysis Pt denies any upcoming cardiac testing Pt encouraged to use to use Singlecare or Goodrx to reduce cost  Patient's chart reviewed by Osvaldo Angst CNRA prior to previsit and patient appropriate for the [place].  Previsit completed and red dot placed by patient's name on their procedure day (on provider's schedule).  . Visit by phone Instructions reviewed with pt and pt states understanding. Instructed to review again prior to procedure. Pt states they will.  Instructions sent by phone with coupon and my chart

## 2022-08-30 NOTE — Telephone Encounter (Signed)
Thank you for the update.  Agree with Plan for evaluation in the Neurology clinic and can reconsider colonoscopy at later date.

## 2022-09-15 ENCOUNTER — Other Ambulatory Visit: Payer: Self-pay | Admitting: Family Medicine

## 2022-09-15 ENCOUNTER — Encounter: Payer: BC Managed Care – PPO | Admitting: Gastroenterology

## 2022-09-16 NOTE — Telephone Encounter (Signed)
Pt LOV was on 07/16/22 Last refill for pt Xanax was on 03/16/2022 Please advise

## 2022-09-25 ENCOUNTER — Ambulatory Visit
Admission: RE | Admit: 2022-09-25 | Discharge: 2022-09-25 | Disposition: A | Discharge status: Home / Self Care | Payer: BC Managed Care – PPO | Source: Ambulatory Visit | Attending: Family Medicine | Admitting: Family Medicine

## 2022-09-25 VITALS — BP 109/75 | HR 61 | Temp 98.7°F | Resp 14 | Ht 68.0 in | Wt 130.0 lb

## 2022-09-25 DIAGNOSIS — B9689 Other specified bacterial agents as the cause of diseases classified elsewhere: Secondary | ICD-10-CM

## 2022-09-25 DIAGNOSIS — J019 Acute sinusitis, unspecified: Secondary | ICD-10-CM

## 2022-09-25 MED ORDER — AMOXICILLIN-POT CLAVULANATE 875-125 MG PO TABS: 1.0000 | ORAL_TABLET | Freq: Two times a day (BID) | ORAL | 0 refills | Status: DC

## 2022-09-25 NOTE — ED Provider Notes (Signed)
[facility]    CSN: [id number] Arrival date & time: 09/25/22  1111      History   Chief Complaint Chief Complaint  Patient presents with   Headache    HPI Destiny Mueller is a 62 y.o. female.   HPI  Patient has had sinus pressure and sinus headache for about 2 weeks.  She is tried over-the-counter medicines.  Is not getting better.  Its mostly behind her eyes.  Little bit of nasal congestion.  A little bit of postnasal drip, but not very much.  No coughing or chest congestion.  No changes in vision.  No history of migraines or headaches.  She may have allergies.  She states she does have recurring sinus problems  Past Medical History:  Diagnosis Date   Anxiety    Breast mass, right    Complication of anesthesia    states her HR dropped after given drugs for colonoscopy   Depression    sees Dr. Nori Riis (GYN)    Hypothyroidism    sees Dr. Chalmers Cater     Patient Active Problem List   Diagnosis Date Noted   Depression with anxiety 10/23/2013   Unspecified hypothyroidism 10/23/2013    Past Surgical History:  Procedure Laterality Date   BREAST BIOPSY Right    BREAST EXCISIONAL BIOPSY Right 2017   BREAST LUMPECTOMY WITH RADIOACTIVE SEED LOCALIZATION Right 12/16/2015   Procedure: RIGHT BREAST LUMPECTOMY WITH RADIOACTIVE SEED LOCALIZATION;  Surgeon: Jackolyn Confer, MD;  Location: [place];  Service: General;  Laterality: Right;   COLONOSCOPY  01/27/2010   per Dr. Maurene Capes, clear    OB History   No obstetric history on file.      Home Medications    Prior to Admission medications   Medication Sig Start Date End Date Taking? Authorizing Provider  amoxicillin-clavulanate (AUGMENTIN) 875-125 MG tablet Take 1 tablet by mouth every 12 (twelve) hours. 09/25/22  Yes Raylene Everts, MD  Omega-3 Fatty Acids (FISH OIL) 1200 MG CAPS Take by mouth daily at 2 PM.   Yes [provider]  ALPRAZolam Duanne Moron) 0.5 MG tablet TAKE 1 TABLET BY MOUTH AT  BEDTIME. 09/16/22   Laurey Morale, MD  Biotin 10 MG TABS 1 tablet Orally Once a day for 30 day(s)    [provider]  cholecalciferol (VITAMIN D3) 10 MCG (400 UNIT) TABS tablet 1 tablet Orally Once a day for 30 day(s)    [provider]  citalopram (CELEXA) 40 MG tablet Take 40 mg by mouth daily.    [provider]  levothyroxine (SYNTHROID, LEVOTHROID) 75 MCG tablet Take 75 mcg by mouth daily before breakfast.    [provider]  Magnesium 200 MG TABS 2 tablets with a meal Orally Once a day for 30 day(s)    [provider]  Multiple Vitamin (MULTI VITAMIN) TABS 1 tablet Orally Once a day for 30 day(s)    [provider]    Family History Family History  Problem Relation Age of Onset   Hypothyroidism Mother    Healthy Father     Social History Social History   Tobacco Use   Smoking status: Former   Smokeless tobacco: Never  Scientific laboratory technician Use: Never used  Substance Use Topics   Alcohol use: Yes    Comment: glass of wine each day   Drug use: No     Allergies   Codeine   Review of Systems Review of Systems See  HPI  Physical Exam Triage Vital Signs ED Triage Vitals  Enc Vitals Group     BP 09/25/22 1126 109/75     Pulse Rate 09/25/22 1126 61     Resp 09/25/22 1126 14     Temp 09/25/22 1126 98.7 F (37.1 C)     Temp Source 09/25/22 1126 Oral     SpO2 09/25/22 1126 100 %     Weight 09/25/22 1129 130 lb (59 kg)     Height 09/25/22 1129 5\' 8"  (1.727 m)     Head Circumference --      Peak Flow --      Pain Score 09/25/22 1128 4     Pain Loc --      Pain Edu? --      Excl. in [place]? --    No data found.  Updated Vital Signs BP 109/75 (BP Location: Right Arm)   Pulse 61   Temp 98.7 F (37.1 C) (Oral)   Resp 14   Ht 5\' 8"  (1.727 m)   Wt 59 kg   SpO2 100%   BMI 19.77 kg/m      Physical Exam Constitutional:      General: She is not in acute distress.    Appearance: She is well-developed. She is not  ill-appearing.  HENT:     Head: Normocephalic and atraumatic.     Right Ear: Tympanic membrane and ear canal normal.     Left Ear: Tympanic membrane normal.     Nose: Nose normal. No congestion.     Comments: Nasal membranes slightly swollen and red.  No rhinorrhea.  Tenderness over facial sinuses    Mouth/Throat:     Pharynx: Oropharynx is clear.  Eyes:     Extraocular Movements: Extraocular movements intact.     Conjunctiva/sclera: Conjunctivae normal.     Pupils: Pupils are equal, round, and reactive to light.  Cardiovascular:     Rate and Rhythm: Normal rate.  Pulmonary:     Effort: Pulmonary effort is normal. No respiratory distress.  Abdominal:     General: There is no distension.     Palpations: Abdomen is soft.  Musculoskeletal:        General: Normal range of motion.     Cervical back: Normal range of motion.  Lymphadenopathy:     Cervical: No cervical adenopathy.  Skin:    General: Skin is warm and dry.  Neurological:     Mental Status: She is alert.      UC Treatments / Results  Labs (all labs ordered are listed, but only abnormal results are displayed) Labs Reviewed - No data to display  EKG   Radiology No results found.  Procedures Procedures (including critical care time)  Medications Ordered in UC Medications - No data to display  Initial Impression / Assessment and Plan / UC Course  I have reviewed the triage vital signs and the nursing notes.  Pertinent labs & imaging results that were available during my care of the patient were reviewed by me and considered in my medical decision making (see chart for details).     Final Clinical Impressions(s) / UC Diagnoses   Final diagnoses:  Acute bacterial sinusitis     Discharge Instructions      Make sure that you are drinking lots of water Continue using Flonase every day until symptoms have resolved May the use in addition a salt water nasal rinse Take Augmentin 2 times a day for a  week  I recommend a probiotic while you are taking the strong antibiotic     ED Prescriptions     Medication Sig Dispense Auth. Provider   amoxicillin-clavulanate (AUGMENTIN) 875-125 MG tablet Take 1 tablet by mouth every 12 (twelve) hours. 14 tablet Raylene Everts, MD      PDMP not reviewed this encounter.   Raylene Everts, MD 09/25/22 1228

## 2022-09-25 NOTE — ED Triage Notes (Signed)
Sinus H/A x 2 weeks  OTC sudafed, motrin, flonase, mucinex - no relief Facial pressure  Denies fever or cough

## 2022-09-25 NOTE — Discharge Instructions (Signed)
Make sure that you are drinking lots of water Continue using Flonase every day until symptoms have resolved May the use in addition a salt water nasal rinse Take Augmentin 2 times a day for a week I recommend a probiotic while you are taking the strong antibiotic

## 2022-10-06 ENCOUNTER — Encounter: Payer: Self-pay | Admitting: Neurology

## 2022-10-06 ENCOUNTER — Ambulatory Visit: Payer: BC Managed Care – PPO | Admitting: Neurology

## 2022-10-06 VITALS — BP 100/68 | HR 70 | Ht 68.0 in | Wt 132.5 lb

## 2022-10-06 DIAGNOSIS — R55 Syncope and collapse: Secondary | ICD-10-CM

## 2022-10-06 NOTE — Progress Notes (Signed)
[facility]  PATIENT: Destiny Mueller DOB: 08-Feb-1961  REQUESTING CLINICIAN: Nuala Alpha, MD HISTORY FROM: Patient  REASON FOR VISIT: Syncope    HISTORICAL  CHIEF COMPLAINT:  Chief Complaint  Patient presents with   New Patient (Initial Visit)    Rm 13, alone Seizure: 08/26/22 but she is unsure if it was a seizure or just fainted during cortisone shot      HISTORY OF PRESENT ILLNESS:  This is a 62 year old woman with past medical history of hypothyroidism, depression, recurrent sinus infection who is presenting after passing out while getting a cortisone shot.  Patient reports last month she was getting a cortisone shot in the right shoulder and became unresponsive for 20 seconds.  She was sitting down, her doctor told her head went back and then forward.  She did not fall to the ground, there was no abnormal movements.  EMS was called but patient did not want to go to the hospital.  Her husband dropped her home.  She reports no confusion, but was slightly tired afterward.  Of note she reported her shoulder pain resolved. She does report a history of passing out while giving blood, getting shots and this was not her first time.  There was also an event of patient having low heart rate during anesthesia.  Denies any frank seizures, no family history of seizure and no seizure risk factors.   OTHER MEDICAL CONDITIONS: Hypothyroidism, Depression, recurrent sinus infection.    REVIEW OF SYSTEMS: Full 14 system review of systems performed and negative with exception of: As noted in the HPI   ALLERGIES: Allergies  Allergen Reactions   Codeine     REACTION: nausea/vomiting    HOME MEDICATIONS: Outpatient Medications Prior to Visit  Medication Sig Dispense Refill   ALPRAZolam (XANAX) 0.5 MG tablet TAKE 1 TABLET BY MOUTH AT BEDTIME. (Patient taking differently: Take 0.25 mg by mouth at bedtime.) 90 tablet 1   Biotin 10 MG TABS 1 tablet Orally Once a day  for 30 day(s)     cholecalciferol (VITAMIN D3) 10 MCG (400 UNIT) TABS tablet 1 tablet Orally Once a day for 30 day(s)     citalopram (CELEXA) 40 MG tablet Take 40 mg by mouth daily.     levothyroxine (SYNTHROID) 88 MCG tablet Take 88 mcg by mouth daily before breakfast.     Magnesium 200 MG TABS 2 tablets with a meal Orally Once a day for 30 day(s)     Melatonin 5 MG CAPS Take 1 capsule by mouth as needed (for sleep).     Multiple Vitamin (MULTI VITAMIN) TABS 1 tablet Orally Once a day for 30 day(s)     Omega-3 Fatty Acids (FISH OIL) 1200 MG CAPS Take by mouth daily at 2 PM.     amoxicillin-clavulanate (AUGMENTIN) 875-125 MG tablet Take 1 tablet by mouth every 12 (twelve) hours. 14 tablet 0   levothyroxine (SYNTHROID, LEVOTHROID) 75 MCG tablet Take 75 mcg by mouth daily before breakfast.     No facility-administered medications prior to visit.    PAST MEDICAL HISTORY: Past Medical History:  Diagnosis Date   Anxiety    Breast mass, right    Complication of anesthesia    states her HR dropped after given drugs for colonoscopy   Depression    sees Dr. Nori Riis (GYN)    Hypothyroidism    sees Dr. Chalmers Cater    Hypothyroidism    Seizures [facility])     PAST SURGICAL HISTORY: Past Surgical  History:  Procedure Laterality Date   BREAST BIOPSY Right    BREAST EXCISIONAL BIOPSY Right 2017   BREAST LUMPECTOMY WITH RADIOACTIVE SEED LOCALIZATION Right 12/16/2015   Procedure: RIGHT BREAST LUMPECTOMY WITH RADIOACTIVE SEED LOCALIZATION;  Surgeon: Jackolyn Confer, MD;  Location: [place];  Service: General;  Laterality: Right;   COLONOSCOPY  01/27/2010   per Dr. Maurene Capes, clear    FAMILY HISTORY: Family History  Problem Relation Age of Onset   Hypothyroidism Mother    Healthy Father     SOCIAL HISTORY: Social History   Socioeconomic History   Marital status: Married    Spouse name: Not on file   Number of children: Not on file   Years of education: Not on file   Highest  education level: Bachelor's degree (e.g., BA, AB, BS)  Occupational History   Not on file  Tobacco Use   Smoking status: Former   Smokeless tobacco: Never  Vaping Use   Vaping Use: Never used  Substance and Sexual Activity   Alcohol use: Yes    Comment: glass of wine each day   Drug use: No   Sexual activity: Not on file  Other Topics Concern   Not on file  Social History Narrative   Not on file   Social Determinants of Health   Financial Resource Strain: Low Risk  (07/13/2022)   Overall Financial Resource Strain (CARDIA)    Difficulty of Paying Living Expenses: Not hard at all  Food Insecurity: No Food Insecurity (07/13/2022)   Hunger Vital Sign    Worried About Running Out of Food in the Last Year: Never true    Ran Out of Food in the Last Year: Never true  Transportation Needs: No Transportation Needs (07/13/2022)   PRAPARE - Hydrologist (Medical): No    Lack of Transportation (Non-Medical): No  Physical Activity: Sufficiently Active (07/13/2022)   Exercise Vital Sign    Days of Exercise per Week: 5 days    Minutes of Exercise per Session: 60 min  Stress: No Stress Concern Present (07/13/2022)   [place]    Feeling of Stress : Only a little  Social Connections: Socially Integrated (07/13/2022)   Social Connection and Isolation Panel [NHANES]    Frequency of Communication with Friends and Family: More than three times a week    Frequency of Social Gatherings with Friends and Family: More than three times a week    Attends Religious Services: 1 to 4 times per year    Active Member of [employer] or Organizations: Yes    Attends Music therapist: More than 4 times per year    Marital Status: Married  Human resources officer Violence: Not on file    PHYSICAL EXAM  GENERAL EXAM/CONSTITUTIONAL: Vitals:  Vitals:   10/06/22 0818  BP: 100/68  Pulse: 70  Weight: 132 lb 8 oz (60.1 kg)   Height: 5\' 8"  (1.727 m)   Body mass index is 20.15 kg/m. Wt Readings from Last 3 Encounters:  10/06/22 132 lb 8 oz (60.1 kg)  09/25/22 130 lb (59 kg)  08/27/22 130 lb (59 kg)   Patient is in no distress; well developed, nourished and groomed; neck is supple  EYES: Visual fields full to confrontation, Extraocular movements intacts,   MUSCULOSKELETAL: Gait, strength, tone, movements noted in Neurologic exam below  NEUROLOGIC: MENTAL STATUS:      No data to display  awake, alert, oriented to person, place and time recent and remote memory intact normal attention and concentration language fluent, comprehension intact, naming intact fund of knowledge appropriate  CRANIAL NERVE:  2nd, 3rd, 4th, 6th - Visual fields full to confrontation, extraocular muscles intact, no nystagmus 5th - facial sensation symmetric 7th - facial strength symmetric 8th - hearing intact 9th - palate elevates symmetrically, uvula midline 11th - shoulder shrug symmetric 12th - tongue protrusion midline  MOTOR:  normal bulk and tone, full strength in the BUE, BLE  SENSORY:  normal and symmetric to light touch  COORDINATION:  finger-nose-finger, fine finger movements normal  REFLEXES:  deep tendon reflexes present and symmetric  GAIT/STATION:  normal   DIAGNOSTIC DATA (LABS, IMAGING, TESTING) - I reviewed patient records, labs, notes, testing and imaging myself where available.  Lab Results  Component Value Date   WBC 6.1 03/16/2022   HGB 12.6 03/16/2022   HCT 37.3 03/16/2022   MCV 91.1 03/16/2022   PLT 172.0 03/16/2022      Component Value Date/Time   NA 139 03/16/2022 1355   K 4.2 03/16/2022 1355   CL 103 03/16/2022 1355   CO2 29 03/16/2022 1355   GLUCOSE 96 03/16/2022 1355   BUN 13 03/16/2022 1355   CREATININE 0.76 03/16/2022 1355   CALCIUM 9.6 03/16/2022 1355   PROT 6.6 03/16/2022 1355   ALBUMIN 4.3 03/16/2022 1355   AST 21 03/16/2022 1355   ALT 10 03/16/2022  1355   ALKPHOS 55 03/16/2022 1355   BILITOT 0.8 03/16/2022 1355   GFRNONAA >60 12/10/2015 1502   GFRAA >60 12/10/2015 1502   Lab Results  Component Value Date   CHOL 177 03/16/2022   HDL 71.20 03/16/2022   LDLCALC 86 03/16/2022   TRIG 96.0 03/16/2022   CHOLHDL 2 03/16/2022   Lab Results  Component Value Date   HGBA1C 5.8 03/16/2022   No results found for: "VITAMINB12" No results found for: "TSH"    ASSESSMENT AND PLAN  62 y.o. year old female with history of hypothyroidism, depression, recurrent sinusitis who is presenting after passing out while getting a cortisone shot.  This was not patient first time.  She had other events when giving blood and getting shot.  Patient likely has vasovagal syncope, low concern for seizure. At this time I will recommend patient to increase hydration and to lay flat whenever she is getting blood or getting any injection.  She voices understanding.  Continue to follow with PCP and return as needed     1. Vasovagal syncope      Patient Instructions  Increase hydration Continue current medications Please lay flat while giving blood or you get any shots Follow-up PCP return as needed.  No orders of the defined types were placed in this encounter.   No orders of the defined types were placed in this encounter.   Return if symptoms worsen or fail to improve.    Alric Ran, MD 10/06/2022, 8:44 AM  [facility] [address], [place], [place] [address] [phone]

## 2022-10-06 NOTE — Patient Instructions (Signed)
Increase hydration Continue current medications Please lay flat while giving blood or you get any shots Follow-up PCP return as needed.

## 2023-04-20 ENCOUNTER — Encounter: Payer: Self-pay | Admitting: Gastroenterology

## 2023-05-23 ENCOUNTER — Other Ambulatory Visit: Payer: Self-pay

## 2023-05-23 ENCOUNTER — Ambulatory Visit (AMBULATORY_SURGERY_CENTER): Payer: BC Managed Care – PPO

## 2023-05-23 VITALS — Ht 68.0 in | Wt 133.0 lb

## 2023-05-23 DIAGNOSIS — Z1211 Encounter for screening for malignant neoplasm of colon: Secondary | ICD-10-CM

## 2023-05-23 MED ORDER — NA SULFATE-K SULFATE-MG SULF 17.5-3.13-1.6 GM/177ML PO SOLN: 1.0000 | Freq: Once | ORAL | 0 refills | Status: AC

## 2023-05-23 NOTE — Progress Notes (Signed)
Denies allergies to eggs or soy products. Denies complication of anesthesia or sedation. Denies use of weight loss medication. Denies use of O2.   Emmi instructions given for colonoscopy.  

## 2023-06-02 ENCOUNTER — Encounter: Payer: Self-pay | Admitting: Gastroenterology

## 2023-06-20 ENCOUNTER — Encounter: Payer: Self-pay | Admitting: Gastroenterology

## 2023-06-20 ENCOUNTER — Ambulatory Visit (AMBULATORY_SURGERY_CENTER): Payer: BC Managed Care – PPO | Admitting: Gastroenterology

## 2023-06-20 VITALS — BP 118/60 | HR 64 | Temp 97.3°F | Resp 14 | Ht 68.0 in | Wt 133.0 lb

## 2023-06-20 DIAGNOSIS — K648 Other hemorrhoids: Secondary | ICD-10-CM | POA: Diagnosis not present

## 2023-06-20 DIAGNOSIS — Z1211 Encounter for screening for malignant neoplasm of colon: Secondary | ICD-10-CM

## 2023-06-20 DIAGNOSIS — K635 Polyp of colon: Secondary | ICD-10-CM

## 2023-06-20 DIAGNOSIS — K641 Second degree hemorrhoids: Secondary | ICD-10-CM

## 2023-06-20 DIAGNOSIS — D125 Benign neoplasm of sigmoid colon: Secondary | ICD-10-CM

## 2023-06-20 HISTORY — PX: COLONOSCOPY: SHX174

## 2023-06-20 MED ORDER — SODIUM CHLORIDE 0.9 % IV SOLN: 500.0000 mL | Freq: Once | INTRAVENOUS | Status: DC

## 2023-06-20 NOTE — Op Note (Signed)
[facility] Patient Name: Destiny Mueller Procedure Date: 06/20/2023 7:33 AM MRN: [id number] Endoscopist: Doristine Locks , MD, [id number] Age: 62 Referring MD:  Date of Birth: Jun 13, 1961 Gender: Female Account #: [id number] Procedure:                Colonoscopy Indications:              Screening for colorectal malignant neoplasm (last                            colonoscopy was more than 10 years ago)                           Last colonoscopy was 01/2010 and normal. Medicines:                Monitored Anesthesia Care Procedure:                Pre-Anesthesia Assessment:                           - Prior to the procedure, a History and Physical                            was performed, and patient medications and                            allergies were reviewed. The patient's tolerance of                            previous anesthesia was also reviewed. The risks                            and benefits of the procedure and the sedation                            options and risks were discussed with the patient.                            All questions were answered, and informed consent                            was obtained. Prior Anticoagulants: The patient has                            taken no anticoagulant or antiplatelet agents. ASA                            Grade Assessment: II - A patient with mild systemic                            disease. After reviewing the risks and benefits,                            the patient was deemed in satisfactory condition to  undergo the procedure.                           After obtaining informed consent, the colonoscope                            was passed under direct vision. Throughout the                            procedure, the patient's blood pressure, pulse, and                            oxygen saturations were monitored continuously. The                            CF [id number] #[id number] was  introduced through the anus                            and advanced to the the terminal ileum. The                            colonoscopy was performed without difficulty. The                            patient tolerated the procedure well. The quality                            of the bowel preparation was good. The terminal                            ileum, ileocecal valve, appendiceal orifice, and                            rectum were photographed. Scope In: 8:03:08 AM Scope Out: 8:17:49 AM Scope Withdrawal Time: 0 hours 9 minutes 27 seconds  Total Procedure Duration: 0 hours 14 minutes 41 seconds  Findings:                 The perianal and digital rectal examinations were                            normal.                           A 4 mm polyp was found in the distal sigmoid colon.                            The polyp was sessile. The polyp was removed with a                            cold snare. Resection and retrieval were complete.                            Estimated blood loss was minimal.  Non-bleeding internal hemorrhoids were found during                            retroflexion. The hemorrhoids were small.                           The exam was otherwise normal throughout the                            remainder of the colon.                           The terminal ileum appeared normal. Complications:            No immediate complications. Estimated Blood Loss:     Estimated blood loss was minimal. Impression:               - One 4 mm polyp in the distal sigmoid colon,                            removed with a cold snare. Resected and retrieved.                           - Non-bleeding internal hemorrhoids.                           - The examined portion of the ileum was normal. Recommendation:           - Patient has a contact number available for                            emergencies. The signs and symptoms of potential                             delayed complications were discussed with the                            patient. Return to normal activities tomorrow.                            Written discharge instructions were provided to the                            patient.                           - Resume previous diet.                           - Continue present medications.                           - Await pathology results.                           - Repeat colonoscopy for surveillance based on  pathology results.                           - Return to GI office PRN. Doristine Locks, MD 06/20/2023 8:22:11 AM

## 2023-06-20 NOTE — Progress Notes (Signed)
Pt's states no medical or surgical changes since previsit or office visit. 

## 2023-06-20 NOTE — Progress Notes (Signed)
GASTROENTEROLOGY PROCEDURE H&P NOTE   Primary Care Physician: Nelwyn Salisbury, MD    Reason for Procedure:  Colon Cancer screening  Plan:    Colonoscopy  Patient is appropriate for endoscopic procedure(s) in the ambulatory ([facility]) setting.  The nature of the procedure, as well as the risks, benefits, and alternatives were carefully and thoroughly reviewed with the patient. Ample time for discussion and questions allowed. The patient understood, was satisfied, and agreed to proceed.     HPI: Destiny Mueller is a 62 y.o. female who presents for colonoscopy for routine Colon Cancer screening.  No active GI symptoms.  No known family history of colon cancer or related malignancy.  Patient is otherwise without complaints or active issues today.  Last colonoscopy was 01/2010 and normal.  Past Medical History:  Diagnosis Date   Allergy    Anxiety    Breast mass, right    Complication of anesthesia    states her HR dropped after given drugs for colonoscopy   Depression    sees Dr. Jennette Kettle (GYN)    Hypothyroidism    sees Dr. Talmage Nap    Hypothyroidism    Seizures [facility])     Past Surgical History:  Procedure Laterality Date   BREAST BIOPSY Right    BREAST EXCISIONAL BIOPSY Right 2017   BREAST LUMPECTOMY WITH RADIOACTIVE SEED LOCALIZATION Right 12/16/2015   Procedure: RIGHT BREAST LUMPECTOMY WITH RADIOACTIVE SEED LOCALIZATION;  Surgeon: Avel Peace, MD;  Location: [facility];  Service: General;  Laterality: Right;   COLONOSCOPY  01/27/2010   per Dr. Dickie La, clear    Prior to Admission medications   Medication Sig Start Date End Date Taking? Authorizing Provider  Biotin 10 MG TABS 1 tablet Orally Once a day for 30 day(s)   Yes [provider]  cholecalciferol (VITAMIN D3) 10 MCG (400 UNIT) TABS tablet 1 tablet Orally Once a day for 30 day(s)   Yes [provider]  citalopram (CELEXA) 40 MG tablet Take 40 mg by mouth daily.   Yes [provider]  fluticasone (FLONASE) 50 MCG/ACT nasal spray Place 1 spray into both nostrils daily.   Yes [provider]  ibuprofen (ADVIL) 200 MG tablet Take 200 mg by mouth every 6 (six) hours as needed.   Yes [provider]  levothyroxine (SYNTHROID) 88 MCG tablet Take 88 mcg by mouth daily before breakfast.   Yes [provider]  Melatonin 5 MG CAPS Take 1 capsule by mouth as needed (for sleep).   Yes [provider]  ALPRAZolam (XANAX) 0.5 MG tablet TAKE 1 TABLET BY MOUTH AT BEDTIME. Patient not taking: Reported on 06/20/2023 09/16/22   Nelwyn Salisbury, MD    Current Outpatient Medications  Medication Sig Dispense Refill   Biotin 10 MG TABS 1 tablet Orally Once a day for 30 day(s)     cholecalciferol (VITAMIN D3) 10 MCG (400 UNIT) TABS tablet 1 tablet Orally Once a day for 30 day(s)     citalopram (CELEXA) 40 MG tablet Take 40 mg by mouth daily.     fluticasone (FLONASE) 50 MCG/ACT nasal spray Place 1 spray into both nostrils daily.     ibuprofen (ADVIL) 200 MG tablet Take 200 mg by mouth every 6 (six) hours as needed.     levothyroxine (SYNTHROID) 88 MCG tablet Take 88 mcg by mouth daily before breakfast.     Melatonin 5 MG CAPS Take 1 capsule by mouth as needed (for sleep).  ALPRAZolam (XANAX) 0.5 MG tablet TAKE 1 TABLET BY MOUTH AT BEDTIME. (Patient not taking: Reported on 06/20/2023) 90 tablet 1   Current Facility-Administered Medications  Medication Dose Route Frequency Provider Last Rate Last Admin   0.9 %  sodium chloride infusion  500 mL Intravenous Once Katalea Ucci V, DO        Allergies as of 06/20/2023 - Review Complete 06/20/2023  Allergen Reaction Noted   Codeine  01/14/2010    Family History  Problem Relation Age of Onset   Hypothyroidism Mother    Healthy Father    Colon cancer Neg Hx    Esophageal cancer Neg Hx    Stomach cancer Neg Hx    Rectal cancer Neg Hx     Social History   Socioeconomic History   Marital  status: Married    Spouse name: Not on file   Number of children: Not on file   Years of education: Not on file   Highest education level: Bachelor's degree (e.g., BA, AB, BS)  Occupational History   Not on file  Tobacco Use   Smoking status: Former   Smokeless tobacco: Never  Vaping Use   Vaping status: Never Used  Substance and Sexual Activity   Alcohol use: Yes    Comment: glass of wine each day   Drug use: No   Sexual activity: Not on file  Other Topics Concern   Not on file  Social History Narrative   Not on file   Social Determinants of Health   Financial Resource Strain: Low Risk  (07/13/2022)   Overall Financial Resource Strain (CARDIA)    Difficulty of Paying Living Expenses: Not hard at all  Food Insecurity: No Food Insecurity (07/13/2022)   Hunger Vital Sign    Worried About Running Out of Food in the Last Year: Never true    Ran Out of Food in the Last Year: Never true  Transportation Needs: No Transportation Needs (07/13/2022)   PRAPARE - Administrator, Civil Service (Medical): No    Lack of Transportation (Non-Medical): No  Physical Activity: Sufficiently Active (07/13/2022)   Exercise Vital Sign    Days of Exercise per Week: 5 days    Minutes of Exercise per Session: 60 min  Stress: No Stress Concern Present (07/13/2022)   [employer] of Occupational Health - Occupational Stress Questionnaire    Feeling of Stress : Only a little  Social Connections: Socially Integrated (07/13/2022)   Social Connection and Isolation Panel [NHANES]    Frequency of Communication with Friends and Family: More than three times a week    Frequency of Social Gatherings with Friends and Family: More than three times a week    Attends Religious Services: 1 to 4 times per year    Active Member of [employer] or Organizations: Yes    Attends Engineer, structural: More than 4 times per year    Marital Status: Married  Catering manager Violence: Not on file    Physical  Exam: Vital signs in last 24 hours: @BP  122/83 (BP Location: Right Arm)   Pulse 65   Temp (!) 97.3 F (36.3 C)   Ht 5\' 8"  (1.727 m)   Wt 133 lb (60.3 kg)   SpO2 100%   BMI 20.22 kg/m  GEN: NAD EYE: Sclerae anicteric ENT: MMM CV: Non-tachycardic Pulm: CTA b/l GI: Soft, NT/ND NEURO:  Alert & Oriented x 3   Doristine Locks, DO [facility]   06/20/2023 7:46 AM

## 2023-06-20 NOTE — Progress Notes (Signed)
Pt resting comfortably. VSS. Airway intact. SBAR complete to RN. All questions answered.   

## 2023-06-20 NOTE — Patient Instructions (Signed)
Handouts provided on polyps and hemorrhoids.  Resume previous diet.  Continue present medications.  Await pathology results.  Repeat colonoscopy for surveillance based on pathology results.  Return to GI office as needed.   YOU HAD AN ENDOSCOPIC PROCEDURE TODAY AT THE [facility]:   Refer to the procedure report that was given to you for any specific questions about what was found during the examination.  If the procedure report does not answer your questions, please call your gastroenterologist to clarify.  If you requested that your care partner not be given the details of your procedure findings, then the procedure report has been included in a sealed envelope for you to review at your convenience later.  YOU SHOULD EXPECT: Some feelings of bloating in the abdomen. Passage of more gas than usual.  Walking can help get rid of the air that was put into your GI tract during the procedure and reduce the bloating. If you had a lower endoscopy (such as a colonoscopy or flexible sigmoidoscopy) you may notice spotting of blood in your stool or on the toilet paper. If you underwent a bowel prep for your procedure, you may not have a normal bowel movement for a few days.  Please Note:  You might notice some irritation and congestion in your nose or some drainage.  This is from the oxygen used during your procedure.  There is no need for concern and it should clear up in a day or so.  SYMPTOMS TO REPORT IMMEDIATELY:  Following lower endoscopy (colonoscopy or flexible sigmoidoscopy):  Excessive amounts of blood in the stool  Significant tenderness or worsening of abdominal pains  Swelling of the abdomen that is new, acute  Fever of 100F or higher  For urgent or emergent issues, a gastroenterologist can be reached at any hour by calling [phone]. Do not use MyChart messaging for urgent concerns.    DIET:  We do recommend a small meal at first, but then you may proceed to your  regular diet.  Drink plenty of fluids but you should avoid alcoholic beverages for 24 hours.  ACTIVITY:  You should plan to take it easy for the rest of today and you should NOT DRIVE or use heavy machinery until tomorrow (because of the sedation medicines used during the test).    FOLLOW UP: Our staff will call the number listed on your records the next business day following your procedure.  We will call around 7:15- 8:00 am to check on you and address any questions or concerns that you may have regarding the information given to you following your procedure. If we do not reach you, we will leave a message.     If any biopsies were taken you will be contacted by phone or by letter within the next 1-3 weeks.  Please call us at [phone] if you have not heard about the biopsies in 3 weeks.    SIGNATURES/CONFIDENTIALITY: You and/or your care partner have signed paperwork which will be entered into your electronic medical record.  These signatures attest to the fact that that the information above on your After Visit Summary has been reviewed and is understood.  Full responsibility of the confidentiality of this discharge information lies with you and/or your care-partner.

## 2023-06-20 NOTE — Progress Notes (Signed)
Called to room to assist during endoscopic procedure.  Patient ID and intended procedure confirmed with present staff. Received instructions for my participation in the procedure from the performing physician.  

## 2023-06-21 ENCOUNTER — Telehealth: Payer: Self-pay

## 2023-06-21 NOTE — Telephone Encounter (Signed)
  Follow up Call-     06/20/2023    7:37 AM  Call back number  Post procedure Call Back phone  # [phone]  Permission to leave phone message Yes     Patient questions:  Do you have a fever, pain , or abdominal swelling? No. Pain Score  0 *  Have you tolerated food without any problems? Yes.    Have you been able to return to your normal activities? Yes.    Do you have any questions about your discharge instructions: Diet   No. Medications  No. Follow up visit  No.  Do you have questions or concerns about your Care? No.  Actions: * If pain score is 4 or above: No action needed, pain <4.

## 2023-06-22 LAB — SURGICAL PATHOLOGY

## 2024-03-07 ENCOUNTER — Telehealth: Payer: Self-pay

## 2024-03-07 NOTE — Telephone Encounter (Signed)
 Pt notified that PCP will be back on the day of the CPE and will place the labs after Dr Johnny sees her. Pt voiced understanding Copied from CRM #[id number]. Topic: Appointments - Appointment Scheduling >> Mar 06, 2024  8:32 AM Rosina BIRCH wrote: Patient/patient representative is calling to schedule an appointment. Refer to attachments for appointment information.   Patient called stating she would like blood work done before her physical

## 2024-03-13 ENCOUNTER — Other Ambulatory Visit: Payer: Self-pay

## 2024-03-13 ENCOUNTER — Ambulatory Visit (INDEPENDENT_AMBULATORY_CARE_PROVIDER_SITE_OTHER): Payer: Self-pay | Admitting: Family Medicine

## 2024-03-13 VITALS — BP 110/76 | HR 71 | Temp 98.4°F | Ht 68.0 in | Wt 131.4 lb

## 2024-03-13 DIAGNOSIS — E039 Hypothyroidism, unspecified: Secondary | ICD-10-CM

## 2024-03-13 DIAGNOSIS — Z136 Encounter for screening for cardiovascular disorders: Secondary | ICD-10-CM

## 2024-03-13 DIAGNOSIS — Z0000 Encounter for general adult medical examination without abnormal findings: Secondary | ICD-10-CM

## 2024-03-13 MED ORDER — ALPRAZOLAM 0.5 MG PO TABS: 0.5000 mg | ORAL_TABLET | Freq: Every day | ORAL | 1 refills | Status: AC

## 2024-03-13 MED ORDER — SCOPOLAMINE 1 MG/3DAYS TD PT72: 1.0000 | MEDICATED_PATCH | TRANSDERMAL | 2 refills | Status: AC

## 2024-03-13 NOTE — Progress Notes (Signed)
 Subjective:    Patient ID: Destiny Mueller, female    DOB: 20-Nov-1960, 63 y.o.   MRN: [id number]  HPI Here for a well exam. She feels well. She sees Dr. Balan for her hypothyroidism. She sees Dr. Rosalynn for GYN exams and mammograms yearly. She asks for a cardiac CT calcium exam for peace of mind. She also has a cruise to [place] ina few weeks and she asks for motion sickness patches.    Review of Systems  Constitutional: Negative.   HENT: Negative.    Eyes: Negative.   Respiratory: Negative.    Cardiovascular: Negative.   Gastrointestinal: Negative.   Genitourinary:  Negative for decreased urine volume, difficulty urinating, dyspareunia, dysuria, enuresis, flank pain, frequency, hematuria, pelvic pain and urgency.  Musculoskeletal: Negative.   Skin: Negative.   Neurological: Negative.  Negative for headaches.  Psychiatric/Behavioral: Negative.         Objective:   Physical Exam Constitutional:      General: She is not in acute distress.    Appearance: Normal appearance. She is well-developed.  HENT:     Head: Normocephalic and atraumatic.     Right Ear: External ear normal.     Left Ear: External ear normal.     Nose: Nose normal.     Mouth/Throat:     Pharynx: No oropharyngeal exudate.  Eyes:     General: No scleral icterus.    Conjunctiva/sclera: Conjunctivae normal.     Pupils: Pupils are equal, round, and reactive to light.  Neck:     Thyroid: No thyromegaly.     Vascular: No JVD.  Cardiovascular:     Rate and Rhythm: Normal rate and regular rhythm.     Pulses: Normal pulses.     Heart sounds: Normal heart sounds. No murmur heard.    No friction rub. No gallop.  Pulmonary:     Effort: Pulmonary effort is normal. No respiratory distress.     Breath sounds: Normal breath sounds. No wheezing or rales.  Chest:     Chest wall: No tenderness.  Abdominal:     General: Bowel sounds are normal. There is no distension.     Palpations: Abdomen is soft. There is no  mass.     Tenderness: There is no abdominal tenderness. There is no guarding or rebound.  Musculoskeletal:        General: No tenderness. Normal range of motion.     Cervical back: Normal range of motion and neck supple.  Lymphadenopathy:     Cervical: No cervical adenopathy.  Skin:    General: Skin is warm and dry.     Findings: No erythema or rash.  Neurological:     General: No focal deficit present.     Mental Status: She is alert and oriented to person, place, and time.     Cranial Nerves: No cranial nerve deficit.     Motor: No abnormal muscle tone.     Coordination: Coordination normal.     Deep Tendon Reflexes: Reflexes are normal and symmetric. Reflexes normal.  Psychiatric:        Mood and Affect: Mood normal.        Behavior: Behavior normal.        Thought Content: Thought content normal.        Judgment: Judgment normal.           Assessment & Plan:  Well exam. We discussed diet and exercise. Get fasting labs. Set up a cardiac CT  calcium test. She can use transdermal scopolamine  patches on the cruise. Garnette Olmsted, MD

## 2024-03-14 LAB — CBC WITH DIFFERENTIAL/PLATELET
Basophils Absolute: 0.1 K/uL (ref 0.0–0.1)
Basophils Relative: 1.5 % (ref 0.0–3.0)
Eosinophils Absolute: 0.1 K/uL (ref 0.0–0.7)
Eosinophils Relative: 1 % (ref 0.0–5.0)
HCT: 39.3 % (ref 36.0–46.0)
Hemoglobin: 13.2 g/dL (ref 12.0–15.0)
Lymphocytes Relative: 35.4 % (ref 12.0–46.0)
Lymphs Abs: 2.2 K/uL (ref 0.7–4.0)
MCHC: 33.5 g/dL (ref 30.0–36.0)
MCV: 90.9 fl (ref 78.0–100.0)
Monocytes Absolute: 0.4 K/uL (ref 0.1–1.0)
Monocytes Relative: 7.3 % (ref 3.0–12.0)
Neutro Abs: 3.4 K/uL (ref 1.4–7.7)
Neutrophils Relative %: 54.8 % (ref 43.0–77.0)
Platelets: 193 K/uL (ref 150.0–400.0)
RBC: 4.32 Mil/uL (ref 3.87–5.11)
RDW: 13 % (ref 11.5–15.5)
WBC: 6.2 K/uL (ref 4.0–10.5)

## 2024-03-14 LAB — BASIC METABOLIC PANEL WITH GFR
BUN: 18 mg/dL (ref 6–23)
CO2: 31 meq/L (ref 19–32)
Calcium: 9.2 mg/dL (ref 8.4–10.5)
Chloride: 101 meq/L (ref 96–112)
Creatinine, Ser: 0.76 mg/dL (ref 0.40–1.20)
GFR: 82.82 mL/min (ref >60.00)
Glucose, Bld: 85 mg/dL (ref 70–99)
Potassium: 4.4 meq/L (ref 3.5–5.1)
Sodium: 138 meq/L (ref 135–145)

## 2024-03-14 LAB — HEMOGLOBIN A1C: Hgb A1c MFr Bld: 6.1 % (ref 4.6–6.5)

## 2024-03-14 LAB — VITAMIN D 25 HYDROXY (VIT D DEFICIENCY, FRACTURES): VITD: 47.68 ng/mL (ref 30.00–100.00)

## 2024-03-14 LAB — LIPID PANEL
Cholesterol: 183 mg/dL (ref 0–200)
HDL: 73 mg/dL (ref >39.00)
LDL Cholesterol: 89 mg/dL (ref 0–99)
NonHDL: 109.89
Total CHOL/HDL Ratio: 3
Triglycerides: 104 mg/dL (ref 0.0–149.0)
VLDL: 20.8 mg/dL (ref 0.0–40.0)

## 2024-03-14 LAB — HEPATIC FUNCTION PANEL
ALT: 8 U/L (ref 0–35)
AST: 19 U/L (ref 0–37)
Albumin: 4.3 g/dL (ref 3.5–5.2)
Alkaline Phosphatase: 64 U/L (ref 39–117)
Bilirubin, Direct: 0.1 mg/dL (ref 0.0–0.3)
Total Bilirubin: 0.6 mg/dL (ref 0.2–1.2)
Total Protein: 6.5 g/dL (ref 6.0–8.3)

## 2024-03-15 ENCOUNTER — Ambulatory Visit: Payer: Self-pay | Admitting: Family Medicine

## 2024-04-05 ENCOUNTER — Ambulatory Visit (HOSPITAL_BASED_OUTPATIENT_CLINIC_OR_DEPARTMENT_OTHER)
Admission: RE | Admit: 2024-04-05 | Discharge: 2024-04-05 | Disposition: A | Discharge status: Home / Self Care | Payer: Self-pay | Source: Ambulatory Visit | Attending: Family Medicine | Admitting: Family Medicine

## 2024-04-05 DIAGNOSIS — Z136 Encounter for screening for cardiovascular disorders: Secondary | ICD-10-CM | POA: Insufficient documentation

## 2024-04-18 ENCOUNTER — Ambulatory Visit (INDEPENDENT_AMBULATORY_CARE_PROVIDER_SITE_OTHER): Admitting: *Deleted

## 2024-04-18 DIAGNOSIS — Z23 Encounter for immunization: Secondary | ICD-10-CM | POA: Diagnosis not present
